# Patient Record
Sex: Male | Born: 1940 | Race: White | Hispanic: No | Smoking: Former smoker
Health system: Southern US, Community
[De-identification: ages and names within clinical notes are randomized; demographics above are authoritative.]

## PROBLEM LIST (undated history)

## (undated) DIAGNOSIS — I420 Dilated cardiomyopathy: Secondary | ICD-10-CM

## (undated) DIAGNOSIS — I5042 Chronic combined systolic (congestive) and diastolic (congestive) heart failure: Secondary | ICD-10-CM

## (undated) DIAGNOSIS — J90 Pleural effusion, not elsewhere classified: Secondary | ICD-10-CM

## (undated) DIAGNOSIS — E43 Unspecified severe protein-calorie malnutrition: Secondary | ICD-10-CM

## (undated) DIAGNOSIS — E7800 Pure hypercholesterolemia, unspecified: Secondary | ICD-10-CM

## (undated) DIAGNOSIS — K869 Disease of pancreas, unspecified: Secondary | ICD-10-CM

## (undated) DIAGNOSIS — E119 Type 2 diabetes mellitus without complications: Secondary | ICD-10-CM

## (undated) DIAGNOSIS — R911 Solitary pulmonary nodule: Secondary | ICD-10-CM

## (undated) DIAGNOSIS — I10 Essential (primary) hypertension: Secondary | ICD-10-CM

## (undated) DIAGNOSIS — S72009A Fracture of unspecified part of neck of unspecified femur, initial encounter for closed fracture: Secondary | ICD-10-CM

## (undated) HISTORY — PX: TONSILLECTOMY: SUR1361

---

## 2013-03-31 ENCOUNTER — Encounter (HOSPITAL_BASED_OUTPATIENT_CLINIC_OR_DEPARTMENT_OTHER): Payer: Self-pay | Admitting: *Deleted

## 2013-03-31 ENCOUNTER — Emergency Department (HOSPITAL_BASED_OUTPATIENT_CLINIC_OR_DEPARTMENT_OTHER)
Admission: EM | Admit: 2013-03-31 | Discharge: 2013-03-31 | Disposition: A | Discharge status: Home / Self Care | Payer: Medicare Other | Attending: Emergency Medicine | Admitting: Emergency Medicine

## 2013-03-31 DIAGNOSIS — Z79899 Other long term (current) drug therapy: Secondary | ICD-10-CM | POA: Insufficient documentation

## 2013-03-31 DIAGNOSIS — Z87891 Personal history of nicotine dependence: Secondary | ICD-10-CM | POA: Insufficient documentation

## 2013-03-31 DIAGNOSIS — E1169 Type 2 diabetes mellitus with other specified complication: Secondary | ICD-10-CM | POA: Insufficient documentation

## 2013-03-31 DIAGNOSIS — R739 Hyperglycemia, unspecified: Secondary | ICD-10-CM

## 2013-03-31 DIAGNOSIS — Z8719 Personal history of other diseases of the digestive system: Secondary | ICD-10-CM | POA: Insufficient documentation

## 2013-03-31 DIAGNOSIS — F0390 Unspecified dementia without behavioral disturbance: Secondary | ICD-10-CM | POA: Insufficient documentation

## 2013-03-31 HISTORY — DX: Disease of pancreas, unspecified: K86.9

## 2013-03-31 LAB — URINALYSIS, ROUTINE W REFLEX MICROSCOPIC
Glucose, UA: >1000 mg/dL — AB
Hgb urine dipstick: NEGATIVE
Protein, ur: NEGATIVE mg/dL
pH: 6 (ref 5.0–8.0)

## 2013-03-31 LAB — CBC WITH DIFFERENTIAL/PLATELET
Eosinophils Absolute: 0.1 10*3/uL (ref 0.0–0.7)
Hemoglobin: 11.5 g/dL — ABNORMAL LOW (ref 13.0–17.0)
Lymphocytes Relative: 26 % (ref 12–46)
Lymphs Abs: 1.3 10*3/uL (ref 0.7–4.0)
MCH: 35 pg — ABNORMAL HIGH (ref 26.0–34.0)
MCV: 99.7 fL (ref 78.0–100.0)
Monocytes Relative: 15 % — ABNORMAL HIGH (ref 3–12)
Neutrophils Relative %: 56 % (ref 43–77)
RBC: 3.29 MIL/uL — ABNORMAL LOW (ref 4.22–5.81)

## 2013-03-31 LAB — URINE MICROSCOPIC-ADD ON

## 2013-03-31 LAB — COMPREHENSIVE METABOLIC PANEL
AST: 40 U/L — ABNORMAL HIGH (ref 0–37)
Albumin: 3.1 g/dL — ABNORMAL LOW (ref 3.5–5.2)
Calcium: 9.4 mg/dL (ref 8.4–10.5)
Creatinine, Ser: 0.5 mg/dL (ref 0.50–1.35)
GFR calc non Af Amer: >90 mL/min (ref >90)

## 2013-03-31 LAB — GLUCOSE, CAPILLARY
Glucose-Capillary: 436 mg/dL — ABNORMAL HIGH (ref 70–99)
Glucose-Capillary: 330 mg/dL — ABNORMAL HIGH (ref 70–99)

## 2013-03-31 MED ORDER — SODIUM CHLORIDE 0.9 % IV BOLUS (SEPSIS)
1000.0000 mL | Freq: Once | INTRAVENOUS | Status: AC
  Administered 2013-03-31: 1000 mL via INTRAVENOUS

## 2013-03-31 NOTE — ED Provider Notes (Signed)
CSN: [id number]     Arrival date & time 03/31/13  1534 History  This chart was scribed for Doug Sou, MD by Ladona Ridgel Day, ED scribe. This patient was seen in room MH10/MH10 and the patient's care was started at 1604. Level V caveat patient chronically confused  First MD Initiated Contact with Patient 03/31/13 1604     Chief Complaint  Patient presents with  . Hyperglycemia   Patient is a 72 y.o. male presenting with hyperglycemia. The history is provided by the patient and a relative. No language interpreter was used.  Hyperglycemia Blood sugar level PTA:  453 Severity:  Moderate Onset quality:  Gradual Duration:  2 hours Timing:  Constant Progression:  Unchanged Chronicity:  New Relieved by:  Nothing Ineffective treatments:  None tried Associated symptoms: no abdominal pain, no chest pain, no fever, no nausea, no shortness of breath and no vomiting    HPI Comments: [name] [name] is a 72 y.o. male who presents to the Emergency Department complaining of high CBG of 453 at home today when measured before dinner this evening, newly diabetic since 1 week ago. 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 Daughter states his BS was fine until later this PM and they came here for elevated BS of 453. He states currently that he feels fine and has been eating well the past week.  He was d/c from [facility] last week and was admitted for duration of 1 week. Daughter states he has been improving and doing well since his d/c. He recently was started on  CREON  1 capsule by mouth 3 times daily before meals and metfomin 500 MG tablet, 2 times daily by mouth. They saw Dr. Leavy Cella after his d/c from [facility] and were previously looking for inpatient rehab for alcoholism but have had difficulty doing so and now are looking for options with assisted living.  Past Medical History  Diagnosis  Date  . Diabetes mellitus without complication   . Pancreatic disorder    History reviewed. No pertinent past surgical history. No family history on file. History  Substance Use Topics  . Smoking status: Former Smoker    Quit date: 03/21/2013  . Smokeless tobacco: Never Used  . Alcohol Use: 12.6 oz/week    21 Cans of beer per week   no cigarettes or alcohol times one week  Review of Systems  Unable to perform ROS: Dementia  Constitutional: Negative for fever and chills.  HENT: Negative for congestion.   Respiratory: Negative for cough and shortness of breath.   Cardiovascular: Negative for chest pain.  Gastrointestinal: Negative for nausea, vomiting and abdominal pain.  Musculoskeletal: Negative for back pain.  Skin: Negative for color change and pallor.  Neurological: Negative for weakness.  All other systems reviewed and are negative.   A complete 10 system review of systems was obtained and all systems are negative except as noted in the HPI and PMH.   Allergies  Review of patient's allergies indicates no known allergies.  Home Medications   Current Outpatient Rx  Name  Route  Sig  Dispense  Refill  . lipase/protease/amylase (CREON-12/PANCREASE) 12000 UNITS CPEP   Oral   Take 1 capsule by mouth 3 (three) times daily before meals.         . metFORMIN (GLUCOPHAGE) 500 MG tablet   Oral   Take 500 mg by mouth 2 (two)  times daily with a meal.         . Multiple Vitamins-Minerals (MULTIVITAMIN WITH MINERALS) tablet   Oral   Take 1 tablet by mouth daily.         . nicotine (NICODERM CQ - DOSED IN MG/24 HOURS) 21 mg/24hr patch   Transdermal   Place 1 patch onto the skin daily.         . Thiamine HCl (VITAMIN B-1) 250 MG tablet   Oral   Take 250 mg by mouth daily.         . Vitamin D, Ergocalciferol, (DRISDOL) 50000 UNITS CAPS   Oral   Take 50,000 Units by mouth every 7 (seven) days.          Triage Vitals: BP 152/68  Pulse 73  Temp(Src) 97.4 F  (36.3 C) (Oral)  Resp 18  Ht 5\' 9"  (1.753 m)  Wt 114 lb (51.71 kg)  BMI 16.83 kg/m2  SpO2 100% Physical Exam  Nursing note and vitals reviewed. Constitutional: He appears well-developed and well-nourished. No distress.  HENT:  Head: Normocephalic and atraumatic.  Eyes: Conjunctivae are normal. Pupils are equal, round, and reactive to light.  Neck: Neck supple. No tracheal deviation present. No thyromegaly present.  Cardiovascular: Normal rate and regular rhythm.   No murmur heard. Pulmonary/Chest: Effort normal and breath sounds normal.  Abdominal: Soft. Bowel sounds are normal. He exhibits no distension. There is no tenderness.  Musculoskeletal: Normal range of motion. He exhibits no edema and no tenderness.  Neurological: He is alert. Coordination normal.  Gait normal  Skin: Skin is warm and dry. No rash noted.  Psychiatric: He has a normal mood and affect.  Pleasant cooperative    ED Course   Procedures (including critical care time) DIAGNOSTIC STUDIES: Oxygen Saturation is 100% on room air, normal by my interpretation.    COORDINATION OF CARE: At 420 PM Discussed treatment plan with patient which includes IV fluids, UA, blood work. Patient agrees.   Labs Reviewed  GLUCOSE, CAPILLARY - Abnormal; Notable for the following:    Glucose-Capillary 436 (*)    All other components within normal limits   No results found. No diagnosis found. Had lengthy discussion with patient and patient's daughter. We will not adjust his medications presently as metformin was felt initially caused diarrhea which has since subsided. Results for orders placed during the hospital encounter of 03/31/13  GLUCOSE, CAPILLARY      Result Value Range   Glucose-Capillary 436 (*) 70 - 99 mg/dL  COMPREHENSIVE METABOLIC PANEL      Result Value Range   Sodium 132 (*) 135 - 145 mEq/L   Potassium 4.7  3.5 - 5.1 mEq/L   Chloride 95 (*) 96 - 112 mEq/L   CO2 28  19 - 32 mEq/L   Glucose, Bld 448 (*) 70  - 99 mg/dL   BUN 10  6 - 23 mg/dL   Creatinine, Ser 1.61  0.50 - 1.35 mg/dL   Calcium 9.4  8.4 - 09.6 mg/dL   Total Protein 6.4  6.0 - 8.3 g/dL   Albumin 3.1 (*) 3.5 - 5.2 g/dL   AST 40 (*) 0 - 37 U/L   ALT 34  0 - 53 U/L   Alkaline Phosphatase 142 (*) 39 - 117 U/L   Total Bilirubin 0.4  0.3 - 1.2 mg/dL   GFR calc non Af Amer >90  >90 mL/min   GFR calc Af Amer >90  >90 mL/min  CBC WITH  DIFFERENTIAL      Result Value Range   WBC 5.0  4.0 - 10.5 K/uL   RBC 3.29 (*) 4.22 - 5.81 MIL/uL   Hemoglobin 11.5 (*) 13.0 - 17.0 g/dL   HCT 16.1 (*) 09.6 - 04.5 %   MCV 99.7  78.0 - 100.0 fL   MCH 35.0 (*) 26.0 - 34.0 pg   MCHC 35.1  30.0 - 36.0 g/dL   RDW 40.9  81.1 - 91.4 %   Platelets 329  150 - 400 K/uL   Neutrophils Relative % 56  43 - 77 %   Neutro Abs 2.8  1.7 - 7.7 K/uL   Lymphocytes Relative 26  12 - 46 %   Lymphs Abs 1.3  0.7 - 4.0 K/uL   Monocytes Relative 15 (*) 3 - 12 %   Monocytes Absolute 0.8  0.1 - 1.0 K/uL   Eosinophils Relative 2  0 - 5 %   Eosinophils Absolute 0.1  0.0 - 0.7 K/uL   Basophils Relative 1  0 - 1 %   Basophils Absolute 0.1  0.0 - 0.1 K/uL  URINALYSIS, ROUTINE W REFLEX MICROSCOPIC      Result Value Range   Color, Urine YELLOW  YELLOW   APPearance CLEAR  CLEAR   Specific Gravity, Urine 1.030  1.005 - 1.030   pH 6.0  5.0 - 8.0   Glucose, UA >1000 (*) NEGATIVE mg/dL   Hgb urine dipstick NEGATIVE  NEGATIVE   Bilirubin Urine NEGATIVE  NEGATIVE   Ketones, ur NEGATIVE  NEGATIVE mg/dL   Protein, ur NEGATIVE  NEGATIVE mg/dL   Urobilinogen, UA 1.0  0.0 - 1.0 mg/dL   Nitrite NEGATIVE  NEGATIVE   Leukocytes, UA NEGATIVE  NEGATIVE  URINE MICROSCOPIC-ADD ON      Result Value Range   Squamous Epithelial / LPF RARE  RARE   WBC, UA 0-2  <3 WBC/hpf   Bacteria, UA RARE  RARE  GLUCOSE, CAPILLARY      Result Value Range   Glucose-Capillary 330 (*) 70 - 99 mg/dL   Comment 1 Notify RN     Comment 2 Documented in Chart     No results found.  MDM   His daughter is  working with Dr.Boyd to find placement for him possibly  assisted-living. He has adequate family support and staying in his daughter's home temporarily, at least for the next few weeks. Patient is to follow up with Dr.Boyd at the office in 2 days. Diagnosis hyperglycemia   Doug Sou, MD 03/31/13 1757

## 2013-03-31 NOTE — ED Notes (Signed)
Newly dx diabetic x 1 week- CBG 453 at home today

## 2013-03-31 NOTE — ED Notes (Signed)
Patient's daughter at bedside reports patient recently admitted to [facility] last wed and was released this past wed 03/28/2013 w/ new diagnosis of diabetes. Pt given RX Metformin Vitamin B, Vitamin D, multi-vitamin,and Creon.  Pt originally from [place] and is living with daughter in her home. Pt began seeing PCP,Dr. Leavy Cella, this week and family is in process of finding patient placement in the area (assisted living services). Daughter reports checked patient's blood sugar tonight before dinner and reported "CBG 453", called MD on-call and was told to come to ED for eval. Pt reports "feel fine", appetite well. Denies symptoms.

## 2013-03-31 NOTE — ED Notes (Signed)
MD at bedside. 

## 2013-03-31 NOTE — ED Notes (Signed)
MD at Bedside.

## 2013-11-28 ENCOUNTER — Emergency Department (HOSPITAL_BASED_OUTPATIENT_CLINIC_OR_DEPARTMENT_OTHER): Payer: Medicare Other

## 2013-11-28 ENCOUNTER — Encounter (HOSPITAL_BASED_OUTPATIENT_CLINIC_OR_DEPARTMENT_OTHER): Payer: Self-pay | Admitting: Emergency Medicine

## 2013-11-28 ENCOUNTER — Inpatient Hospital Stay (HOSPITAL_BASED_OUTPATIENT_CLINIC_OR_DEPARTMENT_OTHER)
Admission: EM | Admit: 2013-11-28 | Discharge: 2013-12-03 | DRG: 291 | Disposition: A | Discharge status: Skilled Nursing Facility | Payer: Medicare Other | Attending: Internal Medicine | Admitting: Internal Medicine

## 2013-11-28 DIAGNOSIS — F1020 Alcohol dependence, uncomplicated: Secondary | ICD-10-CM | POA: Diagnosis present

## 2013-11-28 DIAGNOSIS — Z66 Do not resuscitate: Secondary | ICD-10-CM | POA: Diagnosis present

## 2013-11-28 DIAGNOSIS — E119 Type 2 diabetes mellitus without complications: Secondary | ICD-10-CM

## 2013-11-28 DIAGNOSIS — I5042 Chronic combined systolic (congestive) and diastolic (congestive) heart failure: Secondary | ICD-10-CM

## 2013-11-28 DIAGNOSIS — E1149 Type 2 diabetes mellitus with other diabetic neurological complication: Secondary | ICD-10-CM | POA: Diagnosis present

## 2013-11-28 DIAGNOSIS — F0280 Dementia in other diseases classified elsewhere without behavioral disturbance: Secondary | ICD-10-CM | POA: Diagnosis present

## 2013-11-28 DIAGNOSIS — R531 Weakness: Secondary | ICD-10-CM

## 2013-11-28 DIAGNOSIS — E1142 Type 2 diabetes mellitus with diabetic polyneuropathy: Secondary | ICD-10-CM | POA: Diagnosis present

## 2013-11-28 DIAGNOSIS — F10988 Alcohol use, unspecified with other alcohol-induced disorder: Secondary | ICD-10-CM | POA: Diagnosis present

## 2013-11-28 DIAGNOSIS — E1140 Type 2 diabetes mellitus with diabetic neuropathy, unspecified: Secondary | ICD-10-CM | POA: Diagnosis present

## 2013-11-28 DIAGNOSIS — E1165 Type 2 diabetes mellitus with hyperglycemia: Secondary | ICD-10-CM

## 2013-11-28 DIAGNOSIS — I5021 Acute systolic (congestive) heart failure: Principal | ICD-10-CM | POA: Diagnosis present

## 2013-11-28 DIAGNOSIS — E43 Unspecified severe protein-calorie malnutrition: Secondary | ICD-10-CM | POA: Diagnosis present

## 2013-11-28 DIAGNOSIS — Z79899 Other long term (current) drug therapy: Secondary | ICD-10-CM

## 2013-11-28 DIAGNOSIS — I509 Heart failure, unspecified: Secondary | ICD-10-CM

## 2013-11-28 DIAGNOSIS — E871 Hypo-osmolality and hyponatremia: Secondary | ICD-10-CM | POA: Diagnosis present

## 2013-11-28 DIAGNOSIS — E7800 Pure hypercholesterolemia, unspecified: Secondary | ICD-10-CM

## 2013-11-28 DIAGNOSIS — Z87891 Personal history of nicotine dependence: Secondary | ICD-10-CM

## 2013-11-28 DIAGNOSIS — I059 Rheumatic mitral valve disease, unspecified: Secondary | ICD-10-CM | POA: Diagnosis present

## 2013-11-28 DIAGNOSIS — Z681 Body mass index (BMI) 19 or less, adult: Secondary | ICD-10-CM

## 2013-11-28 DIAGNOSIS — I079 Rheumatic tricuspid valve disease, unspecified: Secondary | ICD-10-CM | POA: Diagnosis present

## 2013-11-28 DIAGNOSIS — R635 Abnormal weight gain: Secondary | ICD-10-CM | POA: Diagnosis present

## 2013-11-28 DIAGNOSIS — Z7982 Long term (current) use of aspirin: Secondary | ICD-10-CM

## 2013-11-28 DIAGNOSIS — I10 Essential (primary) hypertension: Secondary | ICD-10-CM | POA: Diagnosis present

## 2013-11-28 DIAGNOSIS — R0602 Shortness of breath: Secondary | ICD-10-CM

## 2013-11-28 DIAGNOSIS — IMO0002 Reserved for concepts with insufficient information to code with codable children: Secondary | ICD-10-CM | POA: Diagnosis present

## 2013-11-28 DIAGNOSIS — R911 Solitary pulmonary nodule: Secondary | ICD-10-CM | POA: Diagnosis present

## 2013-11-28 DIAGNOSIS — J90 Pleural effusion, not elsewhere classified: Secondary | ICD-10-CM | POA: Diagnosis present

## 2013-11-28 HISTORY — DX: Pure hypercholesterolemia, unspecified: E78.00

## 2013-11-28 HISTORY — DX: Chronic combined systolic (congestive) and diastolic (congestive) heart failure: I50.42

## 2013-11-28 LAB — URINE MICROSCOPIC-ADD ON

## 2013-11-28 LAB — BASIC METABOLIC PANEL
BUN: 12 mg/dL (ref 6–23)
CALCIUM: 9.3 mg/dL (ref 8.4–10.5)
CO2: 27 mEq/L (ref 19–32)
Chloride: 91 mEq/L — ABNORMAL LOW (ref 96–112)
Creatinine, Ser: 0.7 mg/dL (ref 0.50–1.35)
Glucose, Bld: 81 mg/dL (ref 70–99)
Potassium: 4.2 mEq/L (ref 3.7–5.3)
SODIUM: 131 meq/L — AB (ref 137–147)

## 2013-11-28 LAB — TROPONIN I

## 2013-11-28 LAB — URINALYSIS, ROUTINE W REFLEX MICROSCOPIC
Bilirubin Urine: NEGATIVE
Glucose, UA: NEGATIVE mg/dL
Ketones, ur: NEGATIVE mg/dL
LEUKOCYTES UA: NEGATIVE
NITRITE: NEGATIVE
PH: 5.5 (ref 5.0–8.0)
Protein, ur: NEGATIVE mg/dL
SPECIFIC GRAVITY, URINE: 1.009 (ref 1.005–1.030)
Urobilinogen, UA: 0.2 mg/dL (ref 0.0–1.0)

## 2013-11-28 LAB — CBG MONITORING, ED
GLUCOSE-CAPILLARY: 77 mg/dL (ref 70–99)
Glucose-Capillary: 76 mg/dL (ref 70–99)

## 2013-11-28 MED ORDER — FUROSEMIDE 10 MG/ML IJ SOLN
40.0000 mg | Freq: Once | INTRAMUSCULAR | Status: AC
  Administered 2013-11-28: 40 mg via INTRAVENOUS
  Filled 2013-11-28: qty 4

## 2013-11-28 NOTE — ED Notes (Signed)
Pt has attempted to void several times w/o output

## 2013-11-28 NOTE — ED Notes (Signed)
Patient brought to ED by daughter, patient lives in assisted living facility, she sts she has noticed change in activity level and energy, sts patient has been extremely fatigued last 1-2 weeks. Provider at facility ordered blood work and daughter reports the following: BNP 1900 (??) or (19.06). Nursing staff todl daughter to take patient to an ER for CHF Exacerbation. Pt has 20lb weight gain, pitting edema noted to BLE.

## 2013-11-28 NOTE — ED Notes (Signed)
MD at bedside. 

## 2013-11-28 NOTE — ED Notes (Signed)
[facility] at BS-CBG 76-pt's daughter brought in food

## 2013-11-28 NOTE — ED Provider Notes (Addendum)
CSN: [id number]     Arrival date & time 11/28/13  1755 History  This chart was scribed for [name] Sprout, MD by Charline Bills, ED Scribe. The patient was seen in room MH10/MH10. Patient's care was started at 7:17 PM.   Chief Complaint  Patient presents with  . Fatigue    The history is provided by the patient. No language interpreter was used.   HPI Comments: [name] [name] is a 73 y.o. male who presents to the Emergency Department complaining of fatigue onset 1-2 weeks ago. Pt's daughter reports an associated decrease in activity level as well as bilateral leg swelling and bloating. His daughter also reports 15 lb weight gain over the last 1-2 weeks. Pt reports muscle aches, stiffness and intermittent back pain that radiates to his shoulder. Pt's also reports a mild cough and ongoing diarrhea. Pt's daughter is concerned that the medication that the pt was recently placed on has caused the symptoms. Pt was taken off this medication on 11/22/13. Pt denies SOB, chest pain, appetite change and urinary issues.   Past Medical History  Diagnosis Date  . Diabetes mellitus without complication   . Pancreatic disorder    History reviewed. No pertinent past surgical history. No family history on file. History  Substance Use Topics  . Smoking status: Former Smoker    Quit date: 03/21/2013  . Smokeless tobacco: Never Used  . Alcohol Use: 12.6 oz/week    21 Cans of beer per week    Review of Systems  Constitutional: Positive for activity change and fatigue. Negative for appetite change.  Respiratory: Positive for cough. Negative for shortness of breath.   Cardiovascular: Positive for leg swelling. Negative for chest pain.  Gastrointestinal: Positive for diarrhea.  Genitourinary: Negative for dysuria, urgency, frequency and difficulty urinating.  Musculoskeletal: Positive for back pain and myalgias.  All other systems reviewed and are negative.   Allergies  Review of patient's allergies  indicates no known allergies.  Home Medications   Current Outpatient Rx  Name  Route  Sig  Dispense  Refill  . aspirin EC 81 MG tablet   Oral   Take 81 mg by mouth daily.         Marland Kitchen FLUoxetine (PROZAC) 20 MG capsule   Oral   Take 20 mg by mouth daily.         Marland Kitchen glipiZIDE (GLUCOTROL) 10 MG tablet   Oral   Take 10 mg by mouth daily before breakfast.         . lipase/protease/amylase (CREON-12/PANCREASE) 12000 UNITS CPEP   Oral   Take 1 capsule by mouth 3 (three) times daily before meals.         . metFORMIN (GLUCOPHAGE) 500 MG tablet   Oral   Take 500 mg by mouth 2 (two) times daily with a meal.         . Multiple Vitamins-Minerals (MULTIVITAMIN WITH MINERALS) tablet   Oral   Take 1 tablet by mouth daily.         . nicotine (NICODERM CQ - DOSED IN MG/24 HOURS) 21 mg/24hr patch   Transdermal   Place 1 patch onto the skin daily.         . Thiamine HCl (VITAMIN B-1) 250 MG tablet   Oral   Take 250 mg by mouth daily.         . Vitamin D, Ergocalciferol, (DRISDOL) 50000 UNITS CAPS   Oral   Take 50,000 Units by mouth every 7 (seven) days.  Triage Vitals: BP 138/81  Pulse 88  Temp(Src) 97.8 F (36.6 C) (Oral)  Resp 20  Ht 5\' 9"  (1.753 m)  Wt 128 lb (58.06 kg)  BMI 18.89 kg/m2  SpO2 100%  Physical Exam  Nursing note and vitals reviewed. Constitutional: He is oriented to person, place, and time. He appears well-developed and well-nourished. No distress.  HENT:  Head: Normocephalic.  Eyes: Conjunctivae and EOM are normal. Pupils are equal, round, and reactive to light.  Neck: Neck supple.  Cardiovascular: Normal rate, regular rhythm, normal heart sounds and intact distal pulses.   Pulmonary/Chest: Effort normal. No respiratory distress. He has rales (bilateral lower lobes).  Abdominal: Soft. He exhibits no distension. There is no tenderness. There is no rebound and no guarding.  Musculoskeletal: He exhibits edema (2+ pitting edema up to the  thigh bilaterally). He exhibits no tenderness.  Neurological: He is alert and oriented to person, place, and time.  Skin: Skin is warm. He is not diaphoretic.  Psychiatric: He has a normal mood and affect. His behavior is normal.    ED Course  Procedures (including critical care time) DIAGNOSTIC STUDIES: Oxygen Saturation is 100% on RA, normal by my interpretation.    COORDINATION OF CARE: 7:35 PM-Discussed treatment plan which includes medication and return precautions with pt at bedside. Pt agreed to plan.   Labs Review Labs Reviewed  TROPONIN I  URINALYSIS, ROUTINE W REFLEX MICROSCOPIC  BASIC METABOLIC PANEL  CBG MONITORING, ED   Imaging Review Dg Chest 2 View  11/28/2013   CLINICAL DATA:  Fatigue and muscle weakness.  EXAM: CHEST  2 VIEW  COMPARISON:  None.  FINDINGS: Lung volumes are low. There is bibasilar collapse/ consolidation with small bilateral pleural effusions. No evidence for overt pulmonary edema at this time. The cardio pericardial silhouette is enlarged. Bones are diffusely demineralized. Telemetry leads overlie the chest.  IMPRESSION: Bilateral lower lobe collapse/consolidation with small to moderate bilateral pleural effusions.   Electronically Signed   By: Kennith Center M.D.   On: 11/28/2013 20:24     EKG Interpretation   Date/Time:  Wednesday November 28 2013 19:07:49 EDT Ventricular Rate:  88 PR Interval:  160 QRS Duration: 138 QT Interval:  428 QTC Calculation: 517 R Axis:   65 Text Interpretation:  Normal sinus rhythm Possible Left atrial enlargement  Right bundle branch block No previous tracing Confirmed by Anitra Lauth  MD,  Alphonzo Lemmings ([id number]) on 11/28/2013 7:30:36 PM      MDM   Final diagnoses:  CHF (congestive heart failure)   Patient with symptoms concerning for CHF with worsening shortness of breath with ambulation, swelling in his legs and a 20 pound weight gain. He only relating factor is he recently started Actos and was DC'd 5 days ago. Patient  denies chest pain however it's difficult to tell if he had some intermittent chest pain based on his response to questioning. On exam patient has signs of fluid overload. He denies any infectious symptoms and no prior history of CHF. Patient had labs done at the [place] today results are normal CBC and BMP but a BNP of 1900, which is assumed to be new as patient has never had any cardiac issues in the past. EKG with a right bundle branch block and nonspecific ST depression without old to compare. Troponin is negative and chest x-ray with moderate bilateral pleural effusions. Feel patient would benefit from admission and diuresis.  Code status is DNR which was discussed with pt and daughter.  I  personally performed the services described in this documentation, which was scribed in my presence.  The recorded information has been reviewed and considered.    [name] SproutWhitney Alok Minshall, MD 11/28/13 2118  [name] SproutWhitney Zannie Runkle, MD 11/28/13 [phone]

## 2013-11-29 ENCOUNTER — Inpatient Hospital Stay (HOSPITAL_COMMUNITY): Payer: Medicare Other

## 2013-11-29 ENCOUNTER — Encounter (HOSPITAL_COMMUNITY): Payer: Self-pay | Admitting: Internal Medicine

## 2013-11-29 DIAGNOSIS — R5381 Other malaise: Secondary | ICD-10-CM

## 2013-11-29 DIAGNOSIS — I369 Nonrheumatic tricuspid valve disorder, unspecified: Secondary | ICD-10-CM

## 2013-11-29 DIAGNOSIS — I509 Heart failure, unspecified: Secondary | ICD-10-CM

## 2013-11-29 DIAGNOSIS — E1165 Type 2 diabetes mellitus with hyperglycemia: Secondary | ICD-10-CM

## 2013-11-29 DIAGNOSIS — I5021 Acute systolic (congestive) heart failure: Secondary | ICD-10-CM | POA: Diagnosis present

## 2013-11-29 DIAGNOSIS — R5383 Other fatigue: Secondary | ICD-10-CM

## 2013-11-29 DIAGNOSIS — R0602 Shortness of breath: Secondary | ICD-10-CM | POA: Insufficient documentation

## 2013-11-29 DIAGNOSIS — IMO0002 Reserved for concepts with insufficient information to code with codable children: Secondary | ICD-10-CM | POA: Diagnosis present

## 2013-11-29 DIAGNOSIS — I10 Essential (primary) hypertension: Secondary | ICD-10-CM

## 2013-11-29 DIAGNOSIS — F10988 Alcohol use, unspecified with other alcohol-induced disorder: Secondary | ICD-10-CM

## 2013-11-29 DIAGNOSIS — E1140 Type 2 diabetes mellitus with diabetic neuropathy, unspecified: Secondary | ICD-10-CM | POA: Diagnosis present

## 2013-11-29 DIAGNOSIS — E119 Type 2 diabetes mellitus without complications: Secondary | ICD-10-CM

## 2013-11-29 DIAGNOSIS — R531 Weakness: Secondary | ICD-10-CM | POA: Diagnosis present

## 2013-11-29 HISTORY — DX: Essential (primary) hypertension: I10

## 2013-11-29 LAB — HEMOGLOBIN A1C
HEMOGLOBIN A1C: 7.9 % — AB (ref <5.7)
Mean Plasma Glucose: 180 mg/dL — ABNORMAL HIGH (ref <117)

## 2013-11-29 LAB — OSMOLALITY, URINE: OSMOLALITY UR: 280 mosm/kg — AB (ref 390–1090)

## 2013-11-29 LAB — NA AND K (SODIUM & POTASSIUM), RAND UR
Potassium Urine: 12 mEq/L
Sodium, Ur: 116 mEq/L

## 2013-11-29 LAB — GLUCOSE, CAPILLARY
GLUCOSE-CAPILLARY: 108 mg/dL — AB (ref 70–99)
Glucose-Capillary: 100 mg/dL — ABNORMAL HIGH (ref 70–99)
Glucose-Capillary: 138 mg/dL — ABNORMAL HIGH (ref 70–99)
Glucose-Capillary: 159 mg/dL — ABNORMAL HIGH (ref 70–99)
Glucose-Capillary: 64 mg/dL — ABNORMAL LOW (ref 70–99)
Glucose-Capillary: 75 mg/dL (ref 70–99)

## 2013-11-29 MED ORDER — INSULIN ASPART 100 UNIT/ML ~~LOC~~ SOLN: 0.0000 [IU] | Freq: Every day | SUBCUTANEOUS | Status: DC

## 2013-11-29 MED ORDER — LISINOPRIL 2.5 MG PO TABS
2.5000 mg | ORAL_TABLET | Freq: Every day | ORAL | Status: DC
  Administered 2013-11-29: 2.5 mg via ORAL
  Filled 2013-11-29 (×2): qty 1

## 2013-11-29 MED ORDER — FUROSEMIDE 10 MG/ML IJ SOLN
40.0000 mg | Freq: Two times a day (BID) | INTRAMUSCULAR | Status: AC
  Administered 2013-11-29 – 2013-11-30 (×4): 40 mg via INTRAVENOUS
  Filled 2013-11-29 (×4): qty 4

## 2013-11-29 MED ORDER — CARVEDILOL 3.125 MG PO TABS
3.1250 mg | ORAL_TABLET | Freq: Two times a day (BID) | ORAL | Status: DC
  Administered 2013-11-29 – 2013-12-03 (×9): 3.125 mg via ORAL
  Filled 2013-11-29 (×11): qty 1

## 2013-11-29 MED ORDER — INSULIN ASPART 100 UNIT/ML ~~LOC~~ SOLN
0.0000 [IU] | Freq: Three times a day (TID) | SUBCUTANEOUS | Status: DC
  Administered 2013-11-29: 2 [IU] via SUBCUTANEOUS
  Administered 2013-11-30: 1 [IU] via SUBCUTANEOUS
  Administered 2013-11-30 – 2013-12-01 (×2): 3 [IU] via SUBCUTANEOUS
  Administered 2013-12-01 – 2013-12-03 (×4): 2 [IU] via SUBCUTANEOUS

## 2013-11-29 MED ORDER — SODIUM CHLORIDE 0.9 % IV SOLN: 250.0000 mL | INTRAVENOUS | Status: DC | PRN

## 2013-11-29 MED ORDER — ENOXAPARIN SODIUM 30 MG/0.3ML ~~LOC~~ SOLN
30.0000 mg | SUBCUTANEOUS | Status: DC
  Administered 2013-11-29 – 2013-12-02 (×4): 30 mg via SUBCUTANEOUS
  Filled 2013-11-29 (×5): qty 0.3

## 2013-11-29 MED ORDER — ASPIRIN EC 81 MG PO TBEC
81.0000 mg | DELAYED_RELEASE_TABLET | Freq: Every day | ORAL | Status: DC
  Administered 2013-11-29 – 2013-12-03 (×5): 81 mg via ORAL
  Filled 2013-11-29 (×5): qty 1

## 2013-11-29 MED ORDER — GLIPIZIDE 10 MG PO TABS
10.0000 mg | ORAL_TABLET | Freq: Every day | ORAL | Status: DC
  Administered 2013-11-29 – 2013-12-01 (×3): 10 mg via ORAL
  Filled 2013-11-29 (×4): qty 1

## 2013-11-29 MED ORDER — ADULT MULTIVITAMIN W/MINERALS CH
1.0000 | ORAL_TABLET | Freq: Every day | ORAL | Status: DC
  Administered 2013-11-29 – 2013-12-03 (×5): 1 via ORAL
  Filled 2013-11-29 (×5): qty 1

## 2013-11-29 MED ORDER — LEVOFLOXACIN IN D5W 750 MG/150ML IV SOLN
750.0000 mg | INTRAVENOUS | Status: DC
  Administered 2013-11-29: 750 mg via INTRAVENOUS
  Filled 2013-11-29 (×3): qty 150

## 2013-11-29 MED ORDER — VITAMIN B-1 50 MG PO TABS
250.0000 mg | ORAL_TABLET | Freq: Every day | ORAL | Status: DC
  Administered 2013-11-29 – 2013-12-03 (×5): 250 mg via ORAL
  Filled 2013-11-29 (×5): qty 1

## 2013-11-29 MED ORDER — IOHEXOL 300 MG/ML  SOLN
75.0000 mL | Freq: Once | INTRAMUSCULAR | Status: AC | PRN
  Administered 2013-11-29: 75 mL via INTRAVENOUS

## 2013-11-29 MED ORDER — SODIUM CHLORIDE 0.9 % IJ SOLN
3.0000 mL | Freq: Two times a day (BID) | INTRAMUSCULAR | Status: DC
  Administered 2013-11-29 – 2013-12-01 (×6): 3 mL via INTRAVENOUS

## 2013-11-29 MED ORDER — FLUOXETINE HCL 20 MG PO CAPS
20.0000 mg | ORAL_CAPSULE | Freq: Every day | ORAL | Status: DC
  Administered 2013-11-29 – 2013-12-03 (×5): 20 mg via ORAL
  Filled 2013-11-29 (×5): qty 1

## 2013-11-29 MED ORDER — POTASSIUM CHLORIDE CRYS ER 10 MEQ PO TBCR
10.0000 meq | EXTENDED_RELEASE_TABLET | Freq: Every day | ORAL | Status: AC
  Administered 2013-11-29 – 2013-11-30 (×2): 10 meq via ORAL
  Filled 2013-11-29 (×2): qty 1

## 2013-11-29 MED ORDER — NICOTINE 21 MG/24HR TD PT24
21.0000 mg | MEDICATED_PATCH | TRANSDERMAL | Status: DC
  Administered 2013-11-29 – 2013-12-03 (×5): 21 mg via TRANSDERMAL
  Filled 2013-11-29 (×5): qty 1

## 2013-11-29 MED ORDER — VITAMIN D (ERGOCALCIFEROL) 1.25 MG (50000 UNIT) PO CAPS
50000.0000 [IU] | ORAL_CAPSULE | ORAL | Status: DC
  Administered 2013-11-30: 50000 [IU] via ORAL
  Filled 2013-11-29: qty 1

## 2013-11-29 MED ORDER — PANCRELIPASE (LIP-PROT-AMYL) 12000-38000 UNITS PO CPEP
1.0000 | ORAL_CAPSULE | Freq: Three times a day (TID) | ORAL | Status: DC
  Administered 2013-11-29 – 2013-12-03 (×13): 1 via ORAL
  Filled 2013-11-29 (×16): qty 1

## 2013-11-29 MED ORDER — SODIUM CHLORIDE 0.9 % IJ SOLN: 3.0000 mL | INTRAMUSCULAR | Status: DC | PRN

## 2013-11-29 NOTE — H&P (Signed)
[facility] History and Physical  [name] [name] VHQ:[id number] DOB: [birth year] DOA: 11/28/2013  Referring physician: EDP PCP: [name] Au, MD  Specialists:   Chief Complaint:  SOB and Increased Swelling of Legs and Weakness  HPI: [name] [name] is a 73 y.o. male with Dementia, and DM2 who resides in an Area ALF who was sent to the [facility] ED for evaluation due to lethargy and "bloating" which has worsened over the past 3 days.  He has had SOB and Cough as well.   His daughter gives the histtry and reports that over the past 30 days he has rapidly gained weight approximatedly  20 pounds and had noticeable swelling of his legs that was progressing up his body.   She has been concerned that the Actos, a recently added medication may be causing the Swelling.    In the ED, he was evaluated and found to have a BNP in the 1900s, and a chest X-ray which revealed Bilateral Effusions.  He was administered IV Lasix and referred for medical admission.      Review of Systems: Unable to Obtain from the Patient  Past Medical History  Diagnosis Date  . Diabetes mellitus without complication   . Pancreatic disorder     History reviewed. No pertinent past surgical history.     Prior to Admission medications   Medication Sig Start Date End Date Taking? Authorizing Provider  aspirin EC 81 MG tablet Take 81 mg by mouth daily.   Yes Historical Provider, MD  FLUoxetine (PROZAC) 20 MG capsule Take 20 mg by mouth daily.   Yes Historical Provider, MD  glipiZIDE (GLUCOTROL) 10 MG tablet Take 10 mg by mouth daily before breakfast.   Yes Historical Provider, MD  lipase/protease/amylase (CREON-12/PANCREASE) 12000 UNITS CPEP Take 1 capsule by mouth 3 (three) times daily before meals.    Historical Provider, MD  metFORMIN (GLUCOPHAGE) 500 MG tablet Take 500 mg by mouth 2 (two) times daily with a meal.    Historical Provider, MD  Multiple Vitamins-Minerals (MULTIVITAMIN WITH MINERALS) tablet Take 1 tablet  by mouth daily.    Historical Provider, MD  nicotine (NICODERM CQ - DOSED IN MG/24 HOURS) 21 mg/24hr patch Place 1 patch onto the skin daily.    Historical Provider, MD  Thiamine HCl (VITAMIN B-1) 250 MG tablet Take 250 mg by mouth daily.    Historical Provider, MD  Vitamin D, Ergocalciferol, (DRISDOL) 50000 UNITS CAPS Take 50,000 Units by mouth every 7 (seven) days.    Historical Provider, MD      No Known Allergies   Social History:  reports that he quit smoking about 8 months ago. He has never used smokeless tobacco. He reports that he drinks about 12.6 ounces of alcohol per week. He reports that he does not use illicit drugs.     Family History  Problem Relation Age of Onset  . Breast cancer Mother   . CAD Father   . Cancer - Other Sister     Laryngeal       Physical Exam:  GEN:  Pleasant Thin Elderly  73 y.o. Caucasian male  examined  and in no acute distress; cooperative with exam Filed Vitals:   11/28/13 1818 11/28/13 2137 11/28/13 2331 11/28/13 2353  BP: 138/81 135/86 145/82   Pulse: 88 92 91 90  Temp: 97.8 F (36.6 C) 98.2 F (36.8 C) 96.4 F (35.8 C)   TempSrc: Oral Oral Oral   Resp: 20 20 20    Height: 5\' 9"  (  1.753 m)     Weight: 58.06 kg (128 lb)  62.7 kg (138 lb 3.7 oz)   SpO2: 100% 94% 100%    Blood pressure 145/82, pulse 90, temperature 96.4 F (35.8 C), temperature source Oral, resp. rate 20, height 5\' 9"  (1.753 m), weight 62.7 kg (138 lb 3.7 oz), SpO2 100.00%. PSYCH: He is alert and oriented x2; does not appear anxious does not appear depressed; affect is normal HEENT: Normocephalic and Atraumatic, Mucous membranes pink; PERRLA; EOM intact; Fundi:  Benign;  No scleral icterus, Nares: Patent, Oropharynx: Clear,  Fair Dentition, Neck:  FROM, no cervical lymphadenopathy nor thyromegaly or carotid bruit; no JVD; Breasts:: Not examined CHEST WALL: No tenderness CHEST: Normal respiration, clear to auscultation bilaterally HEART: Regular rate and rhythm; no  murmurs rubs or gallops BACK: No kyphosis or scoliosis; no CVA tenderness ABDOMEN: Positive Bowel Sounds, Scaphoid, soft non-tender; no masses, no organomegaly Rectal Exam: Not done EXTREMITIES: No  cyanosis, clubbing or 2+ EDEMA of BLEs; no ulcerations. Genitalia: not examined PULSES: 2+ and symmetric SKIN: Normal hydration no rash or ulceration CNS: Alert and Oriented x 2,  No Focal Deficits Vascular: pulses palpable throughout    Labs on Admission:  Basic Metabolic Panel:  Recent Labs Lab 11/28/13 2235  NA 131*  K 4.2  CL 91*  CO2 27  GLUCOSE 81  BUN 12  CREATININE 0.70  CALCIUM 9.3   Liver Function Tests: No results found for this basename: AST, ALT, ALKPHOS, BILITOT, PROT, ALBUMIN,  in the last 168 hours No results found for this basename: LIPASE, AMYLASE,  in the last 168 hours No results found for this basename: AMMONIA,  in the last 168 hours CBC: No results found for this basename: WBC, NEUTROABS, HGB, HCT, MCV, PLT,  in the last 168 hours Cardiac Enzymes:  Recent Labs Lab 11/28/13 1920  TROPONINI <0.30    BNP (last 3 results) No results found for this basename: PROBNP,  in the last 8760 hours CBG:  Recent Labs Lab 11/28/13 2135 11/28/13 2237 11/28/13 2355  GLUCAP 77 76 75    Radiological Exams on Admission: Dg Chest 2 View  11/28/2013   CLINICAL DATA:  Fatigue and muscle weakness.  EXAM: CHEST  2 VIEW  COMPARISON:  None.  FINDINGS: Lung volumes are low. There is bibasilar collapse/ consolidation with small bilateral pleural effusions. No evidence for overt pulmonary edema at this time. The cardio pericardial silhouette is enlarged. Bones are diffusely demineralized. Telemetry leads overlie the chest.  IMPRESSION: Bilateral lower lobe collapse/consolidation with small to moderate bilateral pleural effusions.   Electronically Signed   By: Kennith CenterEric  Mansell M.D.   On: 11/28/2013 20:24      Assessment/Plan:   73 y.o. 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   Acute CHF- CHF protocol ordered and Diurese with IV Lasix, monitor electrolytes and BUN/Cr.   2D ECHO ordered in AM.     2.   SOB- due to  #1,  O2 PRN. Monitor O2 Sats.    3.   DM2-  Continue  Glipizide RX, Discontinue Glucophage, and Actos Rx due to EDEMA.  SSI coverage Added and check HbA1C.    4.   HTN-  Monitor  BPs.    5.   Weakness due to #1.  And Hyponatremia.    6.  Hyponatremia-  Due to Volume Overload,  Send Urine Electrolytes and urin OSms.    7.    Dementia-  Due to past hx of Alcohol abuse.   ? Wernickes Dementia  8.    DVT Prophylaxis with Lovenox.       Code Status:      DO NOT RESUSCITATE Family Communication:    Daughter at Bedside Disposition Plan:       Inpatient  Time spent:  31 Minutes   Ishita Mcnerney C [facility] Pager [phone]  If 7PM-7AM, please contact night-coverage [email or web address] Password [email or web address] 11/29/2013, 1:01 AM

## 2013-11-29 NOTE — Progress Notes (Signed)
Echocardiogram 2D Echocardiogram has been performed.  [name] [name] 11/29/2013, 11:00 AM

## 2013-11-29 NOTE — Clinical Social Work Psychosocial (Signed)
Clinical Social Work Department BRIEF PSYCHOSOCIAL ASSESSMENT 11/29/2013  Patient:  [name] [name], [name] [name]     Account Number:  [id number]     Admit date:  11/28/2013  Clinical Social Worker:  Baruch Goldmann, CLINICAL SOCIAL WORKER  Date/Time:  11/29/2013 02:15 PM  Referred by:  Physician  Date Referred:  11/29/2013  Other Referral:   From [facility]   Interview type:  Family Other interview type:    PSYCHOSOCIAL DATA Living Status:  FACILITY Admitted from facility:  [facility] Level of care:  Assisted Living Primary support name:  [name] [name] ([phone]) Primary support relationship to patient:  CHILD, ADULT Degree of support available:   Strong    CURRENT CONCERNS Current Concerns  Post-Acute Placement   Other Concerns:    SOCIAL WORK ASSESSMENT / PLAN SW student called pts daughter [name] [name]) via the phone. SW student asked pts daughter if pt planned on returning to [facility] after d/c. Daughter stated "I imagine he would want to return." Daughter also stated that he is living in the assisted living unit and has a private room. Pt has been living in this facility since March 1st per the daughter. Pt doesn't have any HH services, but before moving to [facility], had [place] for about a month, per the daughter. SW student contacted [facility] and spoke with [name] [name] in Intake and [name] [name] stated that it is okay for pt to return at d/c. FL2 is on chart for MD's signature.   Assessment/plan status:  Psychosocial Support/Ongoing Assessment of Needs Other assessment/ plan:   Information/referral to community resources:    PATIENT'S/FAMILY'S RESPONSE TO PLAN OF CARE: SW Student spoke with pts daughter via the phone. Daughter was very supportive of pts return to [facility]. Daughter was thankful for SW Student call and was very pleasant. Sw student also contacted [facility] to ensure that return was okay. Student SW spoke to [place] in Intake. [name] [name]  stated that it was okay for pt to return to facility at d/c.    Baruch Goldmann, [place] Intern  [phone]

## 2013-11-29 NOTE — Progress Notes (Addendum)
CRITICAL VALUE ALERT  Critical value received: CBG=64  Date of notification: 11-29-13  Time of notification:  1618  Critical value read back:yes  Nurse who received alert:  Junie Panning  MD notified (1st page):  Ranga  Time of first page:  1702  MD notified (2nd page):  Time of second page:  Responding MD:  Venetia Constable  Time MD responded:  1703  CBG is now 108. No new orders given will continue to monitor patient for further changes in condition.

## 2013-11-29 NOTE — Progress Notes (Signed)
UR completed Madolin Twaddle K. Zyona Pettaway, RN, BSN, MSHL, CCM  11/29/2013 10:36 AM

## 2013-11-29 NOTE — H&P (Signed)
I have seen and examined Mr Kregel at bedside and reviewed his chart. He came in with SOB/cough, bloating. He is being investigated for CHF. I have added CT chest with contrast to evaluate for possible lung mass, and started Levaquin in addition to other interventions as laid out by Dr Lovell Sheehan. Please refer to her comprehensive assessment and care plan.

## 2013-11-29 NOTE — Progress Notes (Signed)
Report given to receiving RN. Family at bedside and patient sitting on side of the bed. No signs or symptoms of distress or discomfort. No verbal complaints.

## 2013-11-29 NOTE — Progress Notes (Signed)
Pt c/o nagging pain left back shoulder blade. 5/10. Applied hot packs. Pt felt relief.

## 2013-11-29 NOTE — Care Management Note (Addendum)
  Page 1 of 1   11/30/2013     4:30:06 PM   CARE MANAGEMENT NOTE 11/30/2013  Patient:  [name],[name]   Account Number:  [id number]  Date Initiated:  11/29/2013  Documentation initiated by:  Guenther Dunshee  Subjective/Objective Assessment:   Admitted with edema and weight gain, CHF.     Action/Plan:   CM to follow for dispositon needs   Anticipated DC Date:  12/02/2013   Anticipated DC Plan:  ASSISTED LIVING / REST HOME         Choice offered to / List presented to:             Status of service:  In process, will continue to follow Medicare Important Message given?   (If response is "NO", the following Medicare IM given date fields will be blank) Date Medicare IM given:   Date Additional Medicare IM given:    Discharge Disposition:    Per UR Regulation:  Reviewed for med. necessity/level of care/duration of stay  If discussed at Long Length of Stay Meetings, dates discussed:    Comments:  Admitted with leg edema and CHF Social:  From [facility] IV Lasix PT RECS:  pending Disposition:  pending Kesean Serviss RN, BSN, MSHL, CCM 11/30/2013

## 2013-11-30 ENCOUNTER — Encounter (HOSPITAL_COMMUNITY): Payer: Self-pay | Admitting: Cardiology

## 2013-11-30 DIAGNOSIS — R911 Solitary pulmonary nodule: Secondary | ICD-10-CM | POA: Diagnosis present

## 2013-11-30 DIAGNOSIS — J90 Pleural effusion, not elsewhere classified: Secondary | ICD-10-CM | POA: Diagnosis present

## 2013-11-30 LAB — GLUCOSE, CAPILLARY
GLUCOSE-CAPILLARY: 77 mg/dL (ref 70–99)
GLUCOSE-CAPILLARY: 43 mg/dL — AB (ref 70–99)
GLUCOSE-CAPILLARY: 48 mg/dL — AB (ref 70–99)
Glucose-Capillary: 139 mg/dL — ABNORMAL HIGH (ref 70–99)
Glucose-Capillary: 234 mg/dL — ABNORMAL HIGH (ref 70–99)
Glucose-Capillary: 120 mg/dL — ABNORMAL HIGH (ref 70–99)
Glucose-Capillary: 17 mg/dL — CL (ref 70–99)

## 2013-11-30 LAB — MAGNESIUM: Magnesium: 1.7 mg/dL (ref 1.5–2.5)

## 2013-11-30 LAB — CBC
HCT: 32.5 % — ABNORMAL LOW (ref 39.0–52.0)
Hemoglobin: 11.6 g/dL — ABNORMAL LOW (ref 13.0–17.0)
MCH: 31.1 pg (ref 26.0–34.0)
MCHC: 35.7 g/dL (ref 30.0–36.0)
MCV: 87.1 fL (ref 78.0–100.0)
Platelets: 258 10*3/uL (ref 150–400)
RBC: 3.73 MIL/uL — ABNORMAL LOW (ref 4.22–5.81)
RDW: 12.7 % (ref 11.5–15.5)
WBC: 5.5 10*3/uL (ref 4.0–10.5)

## 2013-11-30 LAB — BASIC METABOLIC PANEL
BUN: 13 mg/dL (ref 6–23)
CO2: 27 mEq/L (ref 19–32)
Calcium: 9.2 mg/dL (ref 8.4–10.5)
Chloride: 90 mEq/L — ABNORMAL LOW (ref 96–112)
Creatinine, Ser: 0.68 mg/dL (ref 0.50–1.35)
GFR calc Af Amer: >90 mL/min (ref >90)
GFR calc non Af Amer: >90 mL/min (ref >90)
Glucose, Bld: 72 mg/dL (ref 70–99)
Potassium: 4.8 mEq/L (ref 3.7–5.3)
Sodium: 130 mEq/L — ABNORMAL LOW (ref 137–147)

## 2013-11-30 LAB — CLOSTRIDIUM DIFFICILE BY PCR: Toxigenic C. Difficile by PCR: NEGATIVE

## 2013-11-30 LAB — TSH: TSH: 3.43 u[IU]/mL (ref 0.350–4.500)

## 2013-11-30 LAB — PHOSPHORUS: Phosphorus: 3.5 mg/dL (ref 2.3–4.6)

## 2013-11-30 MED ORDER — DEXTROSE 50 % IV SOLN
INTRAVENOUS | Status: AC
Start: 2013-11-30 — End: 2013-11-30
  Administered 2013-11-30: 50 mL
  Filled 2013-11-30: qty 50

## 2013-11-30 MED ORDER — SIMVASTATIN 20 MG PO TABS
20.0000 mg | ORAL_TABLET | Freq: Every day | ORAL | Status: DC
  Administered 2013-11-30 – 2013-12-02 (×3): 20 mg via ORAL
  Filled 2013-11-30 (×5): qty 1

## 2013-11-30 MED ORDER — LISINOPRIL 5 MG PO TABS
5.0000 mg | ORAL_TABLET | Freq: Every day | ORAL | Status: DC
  Administered 2013-11-30 – 2013-12-03 (×3): 5 mg via ORAL
  Filled 2013-11-30 (×4): qty 1

## 2013-11-30 MED ORDER — DEXTROSE 50 % IV SOLN
INTRAVENOUS | Status: AC
  Administered 2013-11-30: 50 mL
  Filled 2013-11-30: qty 50

## 2013-11-30 NOTE — Progress Notes (Signed)
Report given to receiving RN. Patient in bed resting watching TV. Patient has no verbal complaints and no signs or symptoms of distress or discomfort.  

## 2013-11-30 NOTE — Clinical Social Work Psychosocial (Signed)
The  SW Intern's Psychosocial Assessment and been read and reviewed and agree to it's content.  CSW will continue to monitor for possible date of stability and return to [facility] when medically stable per MD. Clovis Cao on chart for MD's signature.  Lorri Frederick. [place]  [phone]

## 2013-11-30 NOTE — Significant Event (Signed)
Rapid Response Event Note  Overview: Time Called: 2150 Arrival Time: 2153 Event Type: Other (Comment) (CBG 17)  Initial Focused Assessment: Called to bedside for CBG 17, patient had been found by nurse tech confused in bed, patient lethargic, D50 IV given per protocol. Patient's skin cool and dry, initially placed on NRB 100%, switched to [place] 2L sats 100%, RR 18, HR 60, BP 122/64. Recheck BG approximately 10 minutes after first D50 given, CBG 48. Additional D50 IV given by bedside RN. Patient lethargic able to state name and that he is in the hospital (baseline orientation for patient).  Interventions:  Advised bedside RN to recheck BG in 15 minutes, and to call with further needs. Will continue to monitor Event Summary:   at      at    Outcome: Stayed in room and stabalized  Event End Time: 2216  [place] Isidoro Donning

## 2013-11-30 NOTE — Progress Notes (Signed)
CSW spoke with Physical Therapist who stated that she is recommending SNF placement for patient.  CSW met with patient's daughter [name] [name] and ex-wife [name] [name] earlier today and then again this afternoon to discuss d/c options/needs.  Discussed bed search process and they are requesting [name] [name] if possible as this is very close to where daughter lives.  Bed search initiated in [facility] for SNF option.  Patient is aware but due to his level of confusion, is having a difficult time adjusting to this idea. He has only been at [facility] for about a month so was still attempting to transition and adjust there.  Awaiting possible bed offers and will ask weekend CSW to follow up with family re: offers.  Plan d/c first of the week to SNF if medically stable per MD.  [name] [name] updated and placed on chart for MD's signature.  [name] [name]. [place], [place]

## 2013-11-30 NOTE — Progress Notes (Signed)
[facility] PROGRESS NOTE  Sunil Hue ZOX:[id number] DOB: August 26, 1941 DOA: 11/28/2013 PCP: Verlon Au, MD  Brief Narrative [name] [name] is a pleasant 73 y.o. male with Dementia, and DM2 who resides in an Area ALF who was sent to the [facility] ED for evaluation due to lethargy and "bloating" which had worsened over the past 3 days prior to the admission. He also had SOB and cough. Apparently, that over the the patient rapidly gained weight approximatedly 20 pounds in a 30 day period and had noticeable swelling of his legs that was progressing up his body. In the ED, he was evaluated and found to have a BNP in the 1900s, and a chest X-ray which revealed Bilateral Effusions. CT of the chest eventually showed "1. Moderate to large right greater than left layering pleural effusions with compressive atelectasis. 2. Seven mm right middle lobe lung nodule (series 3, image 38). If the patient is at high risk for bronchogenic carcinoma, follow-up chest CT at 3-6 months is recommended". He also had 2-D echocardiogram which showed " Left ventricle: The cavity size was mildly dilated. Wall thickness was normal. The estimated ejection fraction was 15%. Diffuse hypokinesis. Features are consistent with a pseudonormal left ventricular filling pattern, with concomitant abnormal relaxation and increased filling  pressure (grade 2 diastolic dysfunction). Doppler parameters are consistent with high ventricular filling pressure. - Mitral valve: Mild regurgitation. - Left atrium: The atrium was mildly dilated. - Right ventricle: The cavity size was mildly dilated. Systolic function was severely reduced. - Right atrium: The atrium was mildly dilated. - Tricuspid valve: Moderate regurgitation. - Pulmonary arteries: Systolic pressure was mildly to moderately increased. PA peak pressure: 49mm Hg (S). - Pericardium, extracardiac: A trivial pericardial effusion was identified. There was a left pleural effusion". Patient  was seen by cardiology later today. Appreciate help. In the meanwhile, will discontinue Levaquin as there is no evidence of infection therefore his symptoms may be related to the CHF alone. He will need pulmonary evaluation once cardiac issues have been addressed. Today he is not giving a meaningful history.  Plan: Acute CHF/SOB (shortness of breath)/Pleural effusion/Hypertension  Continue Lasix/Coreg/ASA. Add statin  Follow cardiology recommendations Type II or unspecified type diabetes mellitus without mention of complication, not stated as uncontrolled  Hemoglobin A1c 7.9  Blood sugars fluctuating  Continue on current medications. 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 Consult pulmonary once cardiac issues addressed as history of tobacco habituation. DVT/GI prophylaxis  Lovenox  PPI Code Status: Full Code. Family Communication: Spoke with patient's daughter over the phone yesterday(11/29/13) evening. Disposition Plan: May need short-term rehabilitation placement   Consultants:  Cardiology  Procedures:  2-D echocardiogram  Antibiotics:  Levaquin 11/29/13>11/30/13.  HPI/Subjective: No specific complaint.  Objective: Filed Vitals:   11/30/13 0406  BP: 132/88  Pulse: 88  Temp: 97.2 F (36.2 C)  Resp: 18    Intake/Output Summary (Last 24 hours) at 11/30/13 0914 Last data filed at 11/30/13 0904  Gross per 24 hour  Intake    848 ml  Output   1726 ml  Net   -878 ml   Filed Weights   11/28/13 2331 11/29/13 0259 11/30/13 0406  Weight: 62.7 kg (138 lb 3.7 oz) 61.372 kg (135 lb 4.8 oz) 58.786 kg (129 lb 9.6 oz)    Exam:   General:  Appears disoriented  Cardiovascular: S1S2 heard. No murmurs. RRR.  Respiratory: Good air entry bilaterally. No wheezing.  Abdomen: Soft and nontender. Positive bowel sounds.  Musculoskeletal: No pedal edema.  Data  Reviewed: Basic Metabolic Panel:  Recent Labs Lab 11/28/13 2235 11/30/13 0411  NA 131* 130*  K 4.2 4.8  CL 91* 90*  CO2 27 27  GLUCOSE 81 72  BUN 12 13  CREATININE 0.70 0.68  CALCIUM 9.3 9.2  MG  --  1.7  PHOS  --  3.5   Liver Function Tests: No results found for this basename: AST, ALT, ALKPHOS, BILITOT, PROT, ALBUMIN,  in the last 168 hours No results found for this basename: LIPASE, AMYLASE,  in the last 168 hours No results found for this basename: AMMONIA,  in the last 168 hours CBC:  Recent Labs Lab 11/30/13 0411  WBC 5.5  HGB 11.6*  HCT 32.5*  MCV 87.1  PLT 258   Cardiac Enzymes:  Recent Labs Lab 11/28/13 1920  TROPONINI <0.30   BNP (last 3 results) No results found for this basename: PROBNP,  in the last 8760 hours CBG:  Recent Labs Lab 11/29/13 1147 11/29/13 1618 11/29/13 1651 11/29/13 2115 11/30/13 0600  GLUCAP 159* 64* 108* 138* 77    Recent Results (from the past 240 hour(s))  CLOSTRIDIUM DIFFICILE BY PCR     Status: None   Collection Time    11/29/13  4:21 PM      Result Value Ref Range Status   C difficile by pcr NEGATIVE  NEGATIVE Final     Studies: Dg Chest 2 View  11/28/2013   CLINICAL DATA:  Fatigue and muscle weakness.  EXAM: CHEST  2 VIEW  COMPARISON:  None.  FINDINGS: Lung volumes are low. There is bibasilar collapse/ consolidation with small bilateral pleural effusions. No evidence for overt pulmonary edema at this time. The cardio pericardial silhouette is enlarged. Bones are diffusely demineralized. Telemetry leads overlie the chest.  IMPRESSION: Bilateral lower lobe collapse/consolidation with small to moderate bilateral pleural effusions.   Electronically Signed   By: Kennith CenterEric  Mansell M.D.   On: 11/28/2013 20:24   Ct Chest W Contrast  11/29/2013   CLINICAL DATA:  73 year old male with shortness of breath and cough. Recent weight gain and lower extremity swelling. Pleural effusions. Initial encounter.  EXAM: CT CHEST WITH CONTRAST   TECHNIQUE: Multidetector CT imaging of the chest was performed during intravenous contrast administration.  CONTRAST:  75mL OMNIPAQUE IOHEXOL 300 MG/ML  SOLN  COMPARISON:  Chest radiographs 11/28/2013.  FINDINGS: Moderate to large bilateral layering pleural effusions, the right is larger. Simple fluid densitometry suggesting transudate fluid. No pericardial effusion. Cardiomegaly. Widespread atherosclerotic plaque in the aorta. Coronary artery involvement.  Compressive atelectasis in both lungs. There is also a 7 mm right middle lobe lung nodule on series 3, image 38. Major airways are patent.  No acute or suspicious osseous lesion. Mild chronic left lateral tenth rib fracture. Mild scoliosis.  No axillary or mediastinal lymphadenopathy.  Visualized upper abdomen remarkable for confluent dystrophic calcifications throughout the visible pancreas.  IMPRESSION: 1. Moderate to large right greater than left layering pleural effusions with compressive atelectasis. 2. Seven mm right middle lobe lung nodule (series 3, image 38). If the patient is at high risk for bronchogenic carcinoma, follow-up chest CT at 3-6 months is recommended. If the patient is at low risk for bronchogenic carcinoma, follow-up chest CT at 6-12 months is recommended. This recommendation follows the consensus statement: Guidelines for Management of Small Pulmonary Nodules Detected on CT Scans: A Statement from the Fleischner Society as published in  Radiology 2005; 182:993-716. 3. Chronic calcific pancreatitis.   Electronically Signed   By: Augusto Gamble M.D.   On: 11/29/2013 13:54    Scheduled Meds: . aspirin EC  81 mg Oral Daily  . carvedilol  3.125 mg Oral BID WC  . enoxaparin (LOVENOX) injection  30 mg Subcutaneous Q24H  . FLUoxetine  20 mg Oral Daily  . furosemide  40 mg Intravenous Q12H  . glipiZIDE  10 mg Oral QAC breakfast  . insulin aspart  0-5 Units Subcutaneous QHS  . insulin aspart  0-9 Units Subcutaneous TID WC  .  lipase/protease/amylase  1 capsule Oral TID AC  . lisinopril  2.5 mg Oral Daily  . multivitamin with minerals  1 tablet Oral Daily  . nicotine  21 mg Transdermal Q24H  . potassium chloride  10 mEq Oral Daily  . sodium chloride  3 mL Intravenous Q12H  . vitamin B-1  250 mg Oral Daily  . Vitamin D (Ergocalciferol)  50,000 Units Oral Q Fri   Continuous Infusions:      Blythe Veach  [facility] Pager [phone]. If 7PM-7AM, please contact night-coverage at [email or web address], password [facility] 11/30/2013, 9:14 AM  LOS: 2 days

## 2013-11-30 NOTE — Plan of Care (Signed)
Problem: Phase I Progression Outcomes Goal: EF % per last Echo/documented,Core Reminder form on chart Outcome: Completed/Met Date Met:  11/30/13 EF=15%

## 2013-11-30 NOTE — Consult Note (Signed)
HPI: 73 year old male with no prior cardiac history for evaluation of acute systolic congestive heart failure. Patient has a long history of alcohol abuse as well as diabetes mellitus. He resides in an assisted living facility. His daughter recently noticed that he had increased weight gain, bilateral lower extremity edema and dyspnea on exertion. There is orthopnea. He has had some back pain but no chest pain. He was sent to the emergency room and has improved with diuresis. Echocardiogram showed severe LV dysfunction and cardiology is asked to evaluate. Patient has significant dementia.  Medications Prior to Admission  Medication Sig Dispense Refill  . aspirin EC 81 MG tablet Take 81 mg by mouth daily.      . Cholecalciferol (VITAMIN D-3) 1000 UNITS CAPS Take 1,000 Units by mouth daily.      Marland Kitchen FLUoxetine (PROZAC) 20 MG capsule Take 20 mg by mouth daily.      Marland Kitchen glipiZIDE (GLUCOTROL) 10 MG tablet Take 10 mg by mouth 2 (two) times daily before a meal.       . lipase/protease/amylase (CREON-12/PANCREASE) 12000 UNITS CPEP Take 1 capsule by mouth 3 (three) times daily with meals.       . Multiple Vitamins-Minerals (MULTIVITAMIN WITH MINERALS) tablet Take 1 tablet by mouth daily.      . nicotine (NICODERM CQ - DOSED IN MG/24 HOURS) 21 mg/24hr patch Place 21 mg onto the skin daily.        No Known Allergies  Past Medical History  Diagnosis Date  . Diabetes mellitus without complication   . Pancreatic disorder     Past Surgical History  Procedure Laterality Date  . Tonsillectomy      History   Social History  . Marital Status: Divorced    Spouse Name: N/A    Number of Children: 1  . Years of Education: N/A   Occupational History  . Not on file.   Social History Main Topics  . Smoking status: Former Smoker    Quit date: 03/21/2013  . Smokeless tobacco: Never Used  . Alcohol Use: 12.6 oz/week    21 Cans of beer per week  . Drug Use: No  . Sexual Activity: Not on file    Other Topics Concern  . Not on file   Social History Narrative  . No narrative on file    Family History  Problem Relation Age of Onset  . Breast cancer Mother   . CAD Father   . Cancer - Other Sister     Laryngeal    ROS:  no fevers or chills, productive cough, hemoptysis, dysphasia, odynophagia, melena, hematochezia, dysuria, hematuria, rash, seizure activity, claudication. Remaining systems are negative.  Physical Exam:   Blood pressure 132/88, pulse 88, temperature 97.2 F (36.2 C), temperature source Axillary, resp. rate 18, height _0  (1.753 m), weight 129 lb 9.6 oz (58.786 kg), SpO2 98.00%.  General:  Well developed/frail in NAD Skin warm/dry Patient not depressed No peripheral clubbing Back-normal HEENT-normal/normal eyelids Neck supple/normal carotid upstroke bilaterally; no bruits; no JVD; no thyromegaly chest - CTA/ normal expansion CV - RRR/normal S1 and S2; no murmurs, rubs or gallops;  PMI nondisplaced Abdomen -NT/ND, no HSM, no mass, + bowel sounds, no bruit 2+ femoral pulses, no bruits Ext-no edema, no chords, diminished distal pulses Neuro-grossly nonfocal; significant memory deficit and dementia.  ECG sinus rhythm, right bundle branch block.  Results for orders placed during the hospital encounter of 11/28/13 (from the past 48 hour(s))  TROPONIN  I     Status: None   Collection Time    11/28/13  7:20 PM      Result Value Ref Range   Troponin I <0.30  <0.30 ng/mL   Comment:            Due to the release kinetics of cTnI,     a negative result within the first hours     of the onset of symptoms does not rule out     myocardial infarction with certainty.     If myocardial infarction is still suspected,     repeat the test at appropriate intervals.  CBG MONITORING, ED     Status: None   Collection Time    11/28/13  9:35 PM      Result Value Ref Range   Glucose-Capillary 77  70 - 99 mg/dL  URINALYSIS, ROUTINE W REFLEX MICROSCOPIC     Status:  Abnormal   Collection Time    11/28/13 10:31 PM      Result Value Ref Range   Color, Urine YELLOW  YELLOW   APPearance CLEAR  CLEAR   Specific Gravity, Urine 1.009  1.005 - 1.030   pH 5.5  5.0 - 8.0   Glucose, UA NEGATIVE  NEGATIVE mg/dL   Hgb urine dipstick TRACE (*) NEGATIVE   Bilirubin Urine NEGATIVE  NEGATIVE   Ketones, ur NEGATIVE  NEGATIVE mg/dL   Protein, ur NEGATIVE  NEGATIVE mg/dL   Urobilinogen, UA 0.2  0.0 - 1.0 mg/dL   Nitrite NEGATIVE  NEGATIVE   Leukocytes, UA NEGATIVE  NEGATIVE  URINE MICROSCOPIC-ADD ON     Status: None   Collection Time    11/28/13 10:31 PM      Result Value Ref Range   Squamous Epithelial / LPF RARE  RARE   RBC / HPF 0-2  <3 RBC/hpf   Bacteria, UA RARE  RARE  BASIC METABOLIC PANEL     Status: Abnormal   Collection Time    11/28/13 10:35 PM      Result Value Ref Range   Sodium 131 (*) 137 - 147 mEq/L   Potassium 4.2  3.7 - 5.3 mEq/L   Chloride 91 (*) 96 - 112 mEq/L   CO2 27  19 - 32 mEq/L   Glucose, Bld 81  70 - 99 mg/dL   BUN 12  6 - 23 mg/dL   Creatinine, Ser 0.70  0.50 - 1.35 mg/dL   Calcium 9.3  8.4 - 10.5 mg/dL   GFR calc non Af Amer >90  >90 mL/min   GFR calc Af Amer >90  >90 mL/min   Comment: (NOTE)     The eGFR has been calculated using the CKD EPI equation.     This calculation has not been validated in all clinical situations.     eGFR's persistently <90 mL/min signify possible Chronic Kidney     Disease.  CBG MONITORING, ED     Status: None   Collection Time    11/28/13 10:37 PM      Result Value Ref Range   Glucose-Capillary 76  70 - 99 mg/dL  GLUCOSE, CAPILLARY     Status: None   Collection Time    11/28/13 11:55 PM      Result Value Ref Range   Glucose-Capillary 75  70 - 99 mg/dL   Comment 1 Documented in Chart    GLUCOSE, CAPILLARY     Status: Abnormal   Collection Time    11/29/13  6:47 AM      Result Value Ref Range   Glucose-Capillary 100 (*) 70 - 99 mg/dL   Comment 1 Notify RN    HEMOGLOBIN A1C     Status:  Abnormal   Collection Time    11/29/13  8:20 AM      Result Value Ref Range   Hemoglobin A1C 7.9 (*) <5.7 %   Comment: (NOTE)                                                                               According to the ADA Clinical Practice Recommendations for 2011, when     HbA1c is used as a screening test:      >=6.5%   Diagnostic of Diabetes Mellitus               (if abnormal result is confirmed)     5.7-6.4%   Increased risk of developing Diabetes Mellitus     References:Diagnosis and Classification of Diabetes Mellitus,Diabetes     JSHF,0263,78(HYIFO 1):S62-S69 and Standards of Medical Care in             Diabetes - 2011,Diabetes Care,2011,34 (Suppl 1):S11-S61.   Mean Plasma Glucose 180 (*) <117 mg/dL   Comment: Performed at [facility]  NA AND K (SODIUM & POTASSIUM), RAND UR     Status: None   Collection Time    11/29/13 11:00 AM      Result Value Ref Range   Sodium, Ur 116     Potassium Urine Timed 12    OSMOLALITY, URINE     Status: Abnormal   Collection Time    11/29/13 11:00 AM      Result Value Ref Range   Osmolality, Ur 280 (*) 390 - 1090 mOsm/kg   Comment: Performed at [place], CAPILLARY     Status: Abnormal   Collection Time    11/29/13 11:47 AM      Result Value Ref Range   Glucose-Capillary 159 (*) 70 - 99 mg/dL   Comment 1 Notify RN    GLUCOSE, CAPILLARY     Status: Abnormal   Collection Time    11/29/13  4:18 PM      Result Value Ref Range   Glucose-Capillary 64 (*) 70 - 99 mg/dL  CLOSTRIDIUM DIFFICILE BY PCR     Status: None   Collection Time    11/29/13  4:21 PM      Result Value Ref Range   C difficile by pcr NEGATIVE  NEGATIVE  GLUCOSE, CAPILLARY     Status: Abnormal   Collection Time    11/29/13  4:51 PM      Result Value Ref Range   Glucose-Capillary 108 (*) 70 - 99 mg/dL  GLUCOSE, CAPILLARY     Status: Abnormal   Collection Time    11/29/13  9:15 PM      Result Value Ref Range   Glucose-Capillary 138 (*)  70 - 99 mg/dL  BASIC METABOLIC PANEL     Status: Abnormal   Collection Time    11/30/13  4:11 AM      Result Value Ref Range   Sodium 130 (*)  137 - 147 mEq/L   Potassium 4.8  3.7 - 5.3 mEq/L   Chloride 90 (*) 96 - 112 mEq/L   CO2 27  19 - 32 mEq/L   Glucose, Bld 72  70 - 99 mg/dL   BUN 13  6 - 23 mg/dL   Creatinine, Ser 0.68  0.50 - 1.35 mg/dL   Calcium 9.2  8.4 - 10.5 mg/dL   GFR calc non Af Amer >90  >90 mL/min   GFR calc Af Amer >90  >90 mL/min   Comment: (NOTE)     The eGFR has been calculated using the CKD EPI equation.     This calculation has not been validated in all clinical situations.     eGFR's persistently <90 mL/min signify possible Chronic Kidney     Disease.  CBC     Status: Abnormal   Collection Time    11/30/13  4:11 AM      Result Value Ref Range   WBC 5.5  4.0 - 10.5 K/uL   RBC 3.73 (*) 4.22 - 5.81 MIL/uL   Hemoglobin 11.6 (*) 13.0 - 17.0 g/dL   HCT 32.5 (*) 39.0 - 52.0 %   MCV 87.1  78.0 - 100.0 fL   MCH 31.1  26.0 - 34.0 pg   MCHC 35.7  30.0 - 36.0 g/dL   RDW 12.7  11.5 - 15.5 %   Platelets 258  150 - 400 K/uL  TSH     Status: None   Collection Time    11/30/13  4:11 AM      Result Value Ref Range   TSH 3.430  0.350 - 4.500 uIU/mL   Comment: Please note change in reference range.  MAGNESIUM     Status: None   Collection Time    11/30/13  4:11 AM      Result Value Ref Range   Magnesium 1.7  1.5 - 2.5 mg/dL  PHOSPHORUS     Status: None   Collection Time    11/30/13  4:11 AM      Result Value Ref Range   Phosphorus 3.5  2.3 - 4.6 mg/dL  GLUCOSE, CAPILLARY     Status: None   Collection Time    11/30/13  6:00 AM      Result Value Ref Range   Glucose-Capillary 77  70 - 99 mg/dL    Dg Chest 2 View  11/28/2013   CLINICAL DATA:  Fatigue and muscle weakness.  EXAM: CHEST  2 VIEW  COMPARISON:  None.  FINDINGS: Lung volumes are low. There is bibasilar collapse/ consolidation with small bilateral pleural effusions. No evidence for overt pulmonary edema  at this time. The cardio pericardial silhouette is enlarged. Bones are diffusely demineralized. Telemetry leads overlie the chest.  IMPRESSION: Bilateral lower lobe collapse/consolidation with small to moderate bilateral pleural effusions.   Electronically Signed   By: Misty Stanley M.D.   On: 11/28/2013 20:24   Ct Chest W Contrast  11/29/2013   CLINICAL DATA:  73 year old male with shortness of breath and cough. Recent weight gain and lower extremity swelling. Pleural effusions. Initial encounter.  EXAM: CT CHEST WITH CONTRAST  TECHNIQUE: Multidetector CT imaging of the chest was performed during intravenous contrast administration.  CONTRAST:  79m OMNIPAQUE IOHEXOL 300 MG/ML  SOLN  COMPARISON:  Chest radiographs 11/28/2013.  FINDINGS: Moderate to large bilateral layering pleural effusions, the right is larger. Simple fluid densitometry suggesting transudate fluid. No pericardial effusion. Cardiomegaly. Widespread atherosclerotic plaque in the  aorta. Coronary artery involvement.  Compressive atelectasis in both lungs. There is also a 7 mm right middle lobe lung nodule on series 3, image 38. Major airways are patent.  No acute or suspicious osseous lesion. Mild chronic left lateral tenth rib fracture. Mild scoliosis.  No axillary or mediastinal lymphadenopathy.  Visualized upper abdomen remarkable for confluent dystrophic calcifications throughout the visible pancreas.  IMPRESSION: 1. Moderate to large right greater than left layering pleural effusions with compressive atelectasis. 2. Seven mm right middle lobe lung nodule (series 3, image 38). If the patient is at high risk for bronchogenic carcinoma, follow-up chest CT at 3-6 months is recommended. If the patient is at low risk for bronchogenic carcinoma, follow-up chest CT at 6-12 months is recommended. This recommendation follows the consensus statement: Guidelines for Management of Small Pulmonary Nodules Detected on CT Scans: A Statement from the [employer] as published in Radiology 2005; 237:395-400. 3. Chronic calcific pancreatitis.   Electronically Signed   By: Lars Pinks M.D.   On: 11/29/2013 13:54    Assessment/Plan 1 acute systolic congestive heart failure-the patient presented with volume excess and has improved with diuresis. Echocardiogram shows severe LV dysfunction with an ejection fraction of 15%. There is mild mitral regurgitation and moderate tricuspid regurgitation. I will change Lasix to 40 mg by mouth daily and follow renal function. He has been initiated on carvedilol and we will increase this as an outpatient as tolerated by pulse and blood pressure. Continue lisinopril but increase to 5 mg daily. Long discussion with patient's daughter and former wife. Cardiomyopathy may be secondary to alcohol but given diabetes mellitus coronary artery disease is also a possibility. Given his dementia I think he is a poor candidate for aggressive cardiac evaluation such as cardiac catheterization. They agree that medical therapy is indicated with no invasive procedures. 2 diabetes mellitus-management per primary care.  3 history of alcohol abuse.  4 dementia  Kirk Ruths MD 11/30/2013, 9:44 AM

## 2013-11-30 NOTE — Evaluation (Signed)
Physical Therapy Evaluation Patient Details Name: [name] EmoryClyde [name] MRN: [id number] DOB: 1940/10/30 Today's Date: 11/30/2013   History of Present Illness  73 year old male with no prior cardiac history for evaluation of acute systolic congestive heart failure. Patient has a long history of alcohol abuse as well as diabetes mellitus. Pt has dementia.   Clinical Impression  Pt adm due to the above. Presents with decreased independence with mobility secondary to cognitive deficits and balance deficits. Pt is a high fall risk due to deficits listed below. Pt to benefit from skilled acute PT to address deficits and maximize independence. Pt will require 24/7 (A) upon acute D/C; unsure if ALF can provide (A) pt needs at this time. Spoke with family and will recommend SNF for post acute rehab. CSW notified.    Follow Up Recommendations SNF;Supervision/Assistance - 24 hour    Equipment Recommendations  Rolling walker with 5" wheels    Recommendations for Other Services       Precautions / Restrictions Precautions Precautions: Fall Precaution Comments: reports he has fallen at ALF Restrictions Weight Bearing Restrictions: No      Mobility  Bed Mobility Overal bed mobility: Modified Independent                Transfers Overall transfer level: Needs assistance Equipment used: 1 person hand held assist Transfers: Sit to/from Stand Sit to Stand: Min assist         General transfer comment: pt very unsteady with transfers; requires (A) to maintain balance and cues for safety   Ambulation/Gait Ambulation/Gait assistance: Mod assist Ambulation Distance (Feet): 140 Feet Assistive device: 1 person hand held assist Gait Pattern/deviations: Wide base of support;Trunk flexed;Staggering right;Staggering left;Decreased stride length;Step-through pattern Gait velocity: decreased; unsafe to increase    General Gait Details: pt very unsteady with gt; multiple balance losses and seemed  unaware of balance deficits; required mod (A) to maintain balance; pt very fatigued at end of session; decr activity tolerance  Stairs            Wheelchair Mobility    Modified Rankin (Stroke Patients Only)       Balance Overall balance assessment: Needs assistance;History of Falls Sitting-balance support: Feet supported;No upper extremity supported Sitting balance-Leahy Scale: Good     Standing balance support: During functional activity;Single extremity supported Standing balance-Leahy Scale: Poor Standing balance comment: requires UE support; +sway                              Pertinent Vitals/Pain No c/o pain    Home Living Family/patient expects to be discharged to:: Skilled nursing facility               Home Equipment: Dan HumphreysWalker - 2 wheels;Cane - single point Additional Comments: pt was living at ALF; has room with handicap bathroom     Prior Function Level of Independence: Needs assistance   Gait / Transfers Assistance Needed: ambulating independently to/from dining room  ADL's / Homemaking Assistance Needed: ALF provides meals  Comments: family concerend about pt and his bathing habits due to lack of (A); reports ALF can (A) with bathing; unsure as to how much (A) they can provide     Hand Dominance        Extremity/Trunk Assessment   Upper Extremity Assessment: Defer to OT evaluation           Lower Extremity Assessment: Overall WFL for tasks assessed;LLE deficits/detail;RLE deficits/detail RLE Deficits / Details:  strength WFL  LLE Deficits / Details: strength WFL  Cervical / Trunk Assessment: Normal  Communication   Communication: No difficulties  Cognition Arousal/Alertness: Awake/alert Behavior During Therapy: Impulsive Overall Cognitive Status: Impaired/Different from baseline Area of Impairment: Orientation;Attention;Following commands;Awareness;Safety/judgement;Problem solving Orientation Level: Disoriented  to;Situation;Place Current Attention Level: Selective Memory: Decreased short-term memory Following Commands: Follows one step commands with increased time Safety/Judgement: Decreased awareness of deficits;Decreased awareness of safety Awareness: Emergent Problem Solving: Slow processing;Difficulty sequencing;Requires verbal cues;Requires tactile cues General Comments: pt with history of dementia; family reports it has been worsening     General Comments General comments (skin integrity, edema, etc.): extensive discussion with family and pt; recommend SNF     Exercises        Assessment/Plan    PT Assessment Patient needs continued PT services  PT Diagnosis Abnormality of gait   PT Problem List Decreased activity tolerance;Decreased balance;Decreased mobility;Decreased safety awareness;Decreased cognition;Impaired sensation  PT Treatment Interventions DME instruction;Gait training;Functional mobility training;Therapeutic exercise;Therapeutic activities;Balance training;Neuromuscular re-education;Patient/family education   PT Goals (Current goals can be found in the Care Plan section) Acute Rehab PT Goals Patient Stated Goal: to go back to my room i just moved into PT Goal Formulation: With patient/family Time For Goal Achievement: 12/14/13 Potential to Achieve Goals: Fair    Frequency Min 2X/week   Barriers to discharge Decreased caregiver support was living at ALF    Co-evaluation               End of Session Equipment Utilized During Treatment: Gait belt Activity Tolerance: Patient tolerated treatment well Patient left: in bed;with call bell/phone within reach;with family/visitor present Nurse Communication: Mobility status;Other (comment) (D/C recommendation )         Time: 4827-0786 PT Time Calculation (min): 20 min   Charges:   PT Evaluation $Initial PT Evaluation Tier I: 1 Procedure PT Treatments $Gait Training: 8-22 mins   PT G CodesDonell Sievert, [place] [phone] 11/30/2013, 4:33 PM

## 2013-11-30 NOTE — Progress Notes (Signed)
Inpatient Diabetes Program Recommendations  AACE/ADA: New Consensus Statement on Inpatient Glycemic Control (2013)  Target Ranges:  Prepandial:   less than 140 mg/dL      Peak postprandial:   less than 180 mg/dL (1-2 hours)      Critically ill patients:  140 - 180 mg/dL     Results for [name] [name], [name] [name] (MRN [id number]) as of 11/30/2013 08:54  Ref. Range 11/29/2013 06:47 11/29/2013 11:47 11/29/2013 16:18 11/29/2013 16:51 11/29/2013 21:15  Glucose-Capillary Latest Range: 70-99 mg/dL 195 (H) 093 (H) 64 (L) 108 (H) 138 (H)    Results for [name] [name], [name] [name] (MRN [id number]) as of 11/30/2013 08:54  Ref. Range 11/30/2013 06:00  Glucose-Capillary Latest Range: 70-99 mg/dL 77    Results for [name] [name], [name] [name] (MRN [id number]) as of 11/30/2013 08:54  Ref. Range 11/29/2013 08:20  Hemoglobin A1C Latest Range: <5.7 % 7.9 (H)    Admitted with SOB and LE Edema.  +History of DM2, Dementia.  From ALF.  Diabetes history: Type 2 DM Home DM Meds: Glipizide 10 mg bid + Metformin? + Actos?   **Mild hypoglycemia yesterday at 4pm.  Patient currently receiving Glipizide 10 mg bid + Novolog Sensitive SSI tid ac + HS   **MD- If patient continues to have hypoglycemia, may want to reduce Glipizide dose to 5 mg bid   Will follow. Ambrose Finland RN, MSN, CDE Diabetes Coordinator Inpatient Diabetes Program Team Pager: [phone] (8a-10p)

## 2013-11-30 NOTE — Clinical Social Work Placement (Addendum)
     Clinical Social Work Department CLINICAL SOCIAL WORK PLACEMENT NOTE 12/04/2013  Patient:  [name] [name], [name] [name]  Account Number:  [id number] Admit date:  11/28/2013  Clinical Social Worker:  Lupita Leash Elodie Panameno, LCSWA  Date/time:  11/30/2013 04:44 PM  Clinical Social Work is seeking post-discharge placement for this patient at the following level of care:   SKILLED NURSING   (*CSW will update this form in Epic as items are completed)   11/30/2013  Patient/family provided with [facility] Department of Clinical Social Works list of facilities offering this level of care within the geographic area requested by the patient (or if unable, by the patients family).  11/30/2013  Patient/family informed of their freedom to choose among providers that offer the needed level of care, that participate in Medicare, Medicaid or managed care program needed by the patient, have an available bed and are willing to accept the patient.  11/30/2013  Patient/family informed of [facility] ownership interest in [facility], as well as of the fact that they are under no obligation to receive care at this facility.  PASARR submitted to EDS on 11/30/2013 PASARR number received from EDS on 11/30/2013  FL2 transmitted to all facilities in geographic area requested by pt/family on  11/30/2013 FL2 transmitted to all facilities within larger geographic area on   Patient informed that his/her managed care company has contracts with or will negotiate with  certain facilities, including the following:   NA     Patient/family informed of bed offers received:  12/03/2013 Patient chooses bed at [facility] Physician recommends and patient chooses bed at    Patient to be transferred to [facility] on  12/03/2013 Patient to be transferred to facility by Ambulance  Sharin Mons)  The following physician request were entered in Epic:   Additional Comments: 12/03/13  DC to  SNF today.  Ok per patient's daughter- patient remains confused.  Nusing notified and called report to facility.  No further CSW needs identified. CSW signing off. Lorri Frederick. Averi Cacioppo, LCSWA [phone]

## 2013-12-01 DIAGNOSIS — E43 Unspecified severe protein-calorie malnutrition: Secondary | ICD-10-CM | POA: Diagnosis present

## 2013-12-01 DIAGNOSIS — J90 Pleural effusion, not elsewhere classified: Secondary | ICD-10-CM

## 2013-12-01 DIAGNOSIS — R911 Solitary pulmonary nodule: Secondary | ICD-10-CM

## 2013-12-01 DIAGNOSIS — I5021 Acute systolic (congestive) heart failure: Principal | ICD-10-CM

## 2013-12-01 HISTORY — DX: Unspecified severe protein-calorie malnutrition: E43

## 2013-12-01 LAB — CBC
HCT: 31.7 % — ABNORMAL LOW (ref 39.0–52.0)
HEMOGLOBIN: 11.4 g/dL — AB (ref 13.0–17.0)
MCH: 31 pg (ref 26.0–34.0)
MCHC: 36 g/dL (ref 30.0–36.0)
MCV: 86.1 fL (ref 78.0–100.0)
PLATELETS: 230 10*3/uL (ref 150–400)
RBC: 3.68 MIL/uL — AB (ref 4.22–5.81)
RDW: 12.6 % (ref 11.5–15.5)
WBC: 4.7 10*3/uL (ref 4.0–10.5)

## 2013-12-01 LAB — BASIC METABOLIC PANEL
BUN: 13 mg/dL (ref 6–23)
CALCIUM: 8.2 mg/dL — AB (ref 8.4–10.5)
CO2: 24 meq/L (ref 19–32)
Chloride: 87 mEq/L — ABNORMAL LOW (ref 96–112)
Creatinine, Ser: 0.56 mg/dL (ref 0.50–1.35)
GFR calc Af Amer: >90 mL/min (ref >90)
GFR calc non Af Amer: >90 mL/min (ref >90)
GLUCOSE: 128 mg/dL — AB (ref 70–99)
POTASSIUM: 4.2 meq/L (ref 3.7–5.3)
SODIUM: 127 meq/L — AB (ref 137–147)

## 2013-12-01 LAB — GLUCOSE, CAPILLARY
GLUCOSE-CAPILLARY: 142 mg/dL — AB (ref 70–99)
GLUCOSE-CAPILLARY: 223 mg/dL — AB (ref 70–99)
GLUCOSE-CAPILLARY: 68 mg/dL — AB (ref 70–99)
Glucose-Capillary: 120 mg/dL — ABNORMAL HIGH (ref 70–99)
Glucose-Capillary: 188 mg/dL — ABNORMAL HIGH (ref 70–99)
Glucose-Capillary: 88 mg/dL (ref 70–99)

## 2013-12-01 LAB — PRO B NATRIURETIC PEPTIDE: Pro B Natriuretic peptide (BNP): 9675 pg/mL — ABNORMAL HIGH (ref 0–125)

## 2013-12-01 MED ORDER — SPIRONOLACTONE 12.5 MG HALF TABLET
12.5000 mg | ORAL_TABLET | Freq: Every day | ORAL | Status: DC
  Administered 2013-12-01 – 2013-12-03 (×3): 12.5 mg via ORAL
  Filled 2013-12-01 (×3): qty 1

## 2013-12-01 MED ORDER — TAMSULOSIN HCL 0.4 MG PO CAPS
0.4000 mg | ORAL_CAPSULE | Freq: Every day | ORAL | Status: DC
  Administered 2013-12-01 – 2013-12-03 (×3): 0.4 mg via ORAL
  Filled 2013-12-01 (×4): qty 1

## 2013-12-01 NOTE — Progress Notes (Signed)
Patient ID: [name] [name], male   DOB: 08-24-1941, 73 y.o.   MRN: [id number]     Subjective:    Breathing has improved today   Objective:   Temp:  [97.4 F (36.3 C)-97.5 F (36.4 C)] 97.4 F (36.3 C) (04/04 0437) Pulse Rate:  [75-77] 75 (04/04 0437) Resp:  [18-20] 20 (04/04 0437) BP: (115-130)/(64-84) 130/76 mmHg (04/04 0437) SpO2:  [93 %-100 %] 100 % (04/04 0437) Weight:  [121 lb 3.2 oz (54.976 kg)] 121 lb 3.2 oz (54.976 kg) (04/04 0437) Last BM Date: 11/30/13  Filed Weights   11/29/13 0259 11/30/13 0406 12/01/13 0437  Weight: 135 lb 4.8 oz (61.372 kg) 129 lb 9.6 oz (58.786 kg) 121 lb 3.2 oz (54.976 kg)    Intake/Output Summary (Last 24 hours) at 12/01/13 1022 Last data filed at 11/30/13 2000  Gross per 24 hour  Intake    360 ml  Output      0 ml  Net    360 ml    Telemetry: NSR  Exam:  General:NAD  Resp: CTAB  Cardiac: RRR, no m/r/g, no JVD  GI: abdomen soft, NT, ND  MSK: extremities are warm, no edema   Lab Results:  Basic Metabolic Panel:  Recent Labs Lab 11/28/13 2235 11/30/13 0411 12/01/13 0328  NA 131* 130* 127*  K 4.2 4.8 4.2  CL 91* 90* 87*  CO2 27 27 24   GLUCOSE 81 72 128*  BUN 12 13 13   CREATININE 0.70 0.68 0.56  CALCIUM 9.3 9.2 8.2*  MG  --  1.7  --     Liver Function Tests: No results found for this basename: AST, ALT, ALKPHOS, BILITOT, PROT, ALBUMIN,  in the last 168 hours  CBC:  Recent Labs Lab 11/30/13 0411 12/01/13 0328  WBC 5.5 4.7  HGB 11.6* 11.4*  HCT 32.5* 31.7*  MCV 87.1 86.1  PLT 258 230    Cardiac Enzymes:  Recent Labs Lab 11/28/13 1920  TROPONINI <0.30    BNP:  Recent Labs  12/01/13 0328  PROBNP 9675.0*    Coagulation: No results found for this basename: INR,  in the last 168 hours  ECG:   Medications:   Scheduled Medications: . aspirin EC  81 mg Oral Daily  . carvedilol  3.125 mg Oral BID WC  . enoxaparin (LOVENOX) injection  30 mg Subcutaneous Q24H  . FLUoxetine  20 mg Oral Daily    . glipiZIDE  10 mg Oral QAC breakfast  . insulin aspart  0-5 Units Subcutaneous QHS  . insulin aspart  0-9 Units Subcutaneous TID WC  . lipase/protease/amylase  1 capsule Oral TID AC  . lisinopril  5 mg Oral Daily  . multivitamin with minerals  1 tablet Oral Daily  . nicotine  21 mg Transdermal Q24H  . simvastatin  20 mg Oral q1800  . sodium chloride  3 mL Intravenous Q12H  . spironolactone  12.5 mg Oral Daily  . tamsulosin  0.4 mg Oral QPC breakfast  . vitamin B-1  250 mg Oral Daily  . Vitamin D (Ergocalciferol)  50,000 Units Oral Q Fri     Infusions:     PRN Medications:  sodium chloride, sodium chloride     Assessment/Plan    73 yo male with hx of DM, EtOH abuse admitted with SOB, DOE, LE edema, and orthopnea.   1. Acute systolic heart failure - echo 1/3/084/2/15 LVEF 15%, diffuse hypokinesis, grade II diastolic dysfunction. Severe RV dysfunction. PASP 49 - he is + 600mL  yesterday, net negative 1.9 liters since admission. Currently not on diuretic. He appears euvolemic, his current hyponatremia may be related to diuresis.   - he is on low dose beta blocker and ACE-I that can be further titrated overtime as outpatient. Started on low dose sprironolactone.  - given history of significant dementia will defer on pursuing ischemic evaluation at this time, work to optimize medical management.      Dina Rich, M.D., F.A.C.C.

## 2013-12-01 NOTE — Progress Notes (Signed)
Hypoglycemic Event  CBG: 17  Treatment: D50 IV 50 mL  Symptoms: Pale and Sweaty  Follow-up CBG: Time:2230 CBG Result:120  Possible Reasons for Event:   Comments/MD notified:rapid response and triad hospitalist notified. Total of 2 amp d50 given    Ram Haugan, [name] [name]  Remember to initiate Hypoglycemia Order Set & complete

## 2013-12-01 NOTE — Progress Notes (Addendum)
[facility] PROGRESS NOTE Interim History: 73 year old male with no prior cardiac history for evaluation of acute systolic congestive heart failure. Patient has a long history of alcohol abuse as well as diabetes mellitus. He resides in an assisted living facility. His daughter recently noticed that he had increased weight gain, bilateral lower extremity edema and dyspnea on exertion. There is orthopnea. He has had some back pain but no chest pain. He was sent to the emergency room and has improved with diuresis.  Assessment/Plan: Acute systolic CHF (congestive heart failure), NYHA class 2/Pleural effusion - possible due to ETOH abuse. - On IV lasix, mod UOP. -JVD no lower extremity edema.No PND. - Strict I and os' daily weights. - Monitor electrolytes replete as needed. - On lasix, ACE-I and coreg. May benefit from low dose spironolactone.  Hyponatremia: - due to HF.  Incidental lung nodule, > 81mm and < 98mm - follow up with pulmonary as an outpatient.  Hypertension - stable, see HF.  Type II or unspecified type diabetes mellitus without mention of complication, not stated as uncontrolled - cont glipizide stable.  Weakness generalized - PT consult   Severe protein caloric malnutrition:   Code Status: DO NOT RESUSCITATE  Family Communication: Daughter at Bedside  Disposition Plan: Inpatient    Consultants:  Cardiology  Procedures: ECHO 4.2.2015: estimated ejection fraction was 15%. Diffuse hypokinesis. Features are consistent with a pseudonormal left ventricular filling pattern, with concomitant abnormal relaxation and increased filling Pressure.    Antibiotics:  none  HPI/Subjective: No ocmplains  Objective: Filed Vitals:   11/30/13 1802 11/30/13 2152 11/30/13 2206 12/01/13 0437  BP: 122/84 122/64  130/76  Pulse: 77   75  Temp:    97.4 F (36.3 C)  TempSrc:    Oral  Resp:    20  Height:      Weight:    54.976 kg (121 lb 3.2 oz)  SpO2:  93%  100% 100%    Intake/Output Summary (Last 24 hours) at 12/01/13 0742 Last data filed at 11/30/13 2000  Gross per 24 hour  Intake    600 ml  Output      0 ml  Net    600 ml   Filed Weights   11/29/13 0259 11/30/13 0406 12/01/13 0437  Weight: 61.372 kg (135 lb 4.8 oz) 58.786 kg (129 lb 9.6 oz) 54.976 kg (121 lb 3.2 oz)    Exam:  General: Alert, awake, oriented x3, in no acute distress.  HEENT: No bruits, no goiter. -JVD Heart: Regular rate and rhythm, without murmurs, rubs, gallops.  Lungs: Good air movement, clear Abdomen: Soft, nontender, nondistended, positive bowel sounds.   Data Reviewed: Basic Metabolic Panel:  Recent Labs Lab 11/28/13 2235 11/30/13 0411 12/01/13 0328  NA 131* 130* 127*  K 4.2 4.8 4.2  CL 91* 90* 87*  CO2 27 27 24   GLUCOSE 81 72 128*  BUN 12 13 13   CREATININE 0.70 0.68 0.56  CALCIUM 9.3 9.2 8.2*  MG  --  1.7  --   PHOS  --  3.5  --    Liver Function Tests: No results found for this basename: AST, ALT, ALKPHOS, BILITOT, PROT, ALBUMIN,  in the last 168 hours No results found for this basename: LIPASE, AMYLASE,  in the last 168 hours No results found for this basename: AMMONIA,  in the last 168 hours CBC:  Recent Labs Lab 11/30/13 0411 12/01/13 0328  WBC 5.5 4.7  HGB 11.6* 11.4*  HCT 32.5* 31.7*  MCV 87.1 86.1  PLT 258 230   Cardiac Enzymes:  Recent Labs Lab 11/28/13 1920  TROPONINI <0.30   BNP (last 3 results)  Recent Labs  12/01/13 0328  PROBNP 9675.0*   CBG:  Recent Labs Lab 11/30/13 2155 11/30/13 2201 11/30/13 2219 12/01/13 0327 12/01/13 0615  GLUCAP 43* 48* 120* 142* 120*    Recent Results (from the past 240 hour(s))  CLOSTRIDIUM DIFFICILE BY PCR     Status: None   Collection Time    11/29/13  4:21 PM      Result Value Ref Range Status   C difficile by pcr NEGATIVE  NEGATIVE Final     Studies: Ct Chest W Contrast  11/29/2013   CLINICAL DATA:  73 year old male with shortness of breath and cough. Recent  weight gain and lower extremity swelling. Pleural effusions. Initial encounter.  EXAM: CT CHEST WITH CONTRAST  TECHNIQUE: Multidetector CT imaging of the chest was performed during intravenous contrast administration.  CONTRAST:  75mL OMNIPAQUE IOHEXOL 300 MG/ML  SOLN  COMPARISON:  Chest radiographs 11/28/2013.  FINDINGS: Moderate to large bilateral layering pleural effusions, the right is larger. Simple fluid densitometry suggesting transudate fluid. No pericardial effusion. Cardiomegaly. Widespread atherosclerotic plaque in the aorta. Coronary artery involvement.  Compressive atelectasis in both lungs. There is also a 7 mm right middle lobe lung nodule on series 3, image 38. Major airways are patent.  No acute or suspicious osseous lesion. Mild chronic left lateral tenth rib fracture. Mild scoliosis.  No axillary or mediastinal lymphadenopathy.  Visualized upper abdomen remarkable for confluent dystrophic calcifications throughout the visible pancreas.  IMPRESSION: 1. Moderate to large right greater than left layering pleural effusions with compressive atelectasis. 2. Seven mm right middle lobe lung nodule (series 3, image 38). If the patient is at high risk for bronchogenic carcinoma, follow-up chest CT at 3-6 months is recommended. If the patient is at low risk for bronchogenic carcinoma, follow-up chest CT at 6-12 months is recommended. This recommendation follows the consensus statement: Guidelines for Management of Small Pulmonary Nodules Detected on CT Scans: A Statement from the Fleischner Society as published in Radiology 2005; 237:395-400. 3. Chronic calcific pancreatitis.   Electronically Signed   By: Augusto GambleLee  Hall M.D.   On: 11/29/2013 13:54    Scheduled Meds: . aspirin EC  81 mg Oral Daily  . carvedilol  3.125 mg Oral BID WC  . enoxaparin (LOVENOX) injection  30 mg Subcutaneous Q24H  . FLUoxetine  20 mg Oral Daily  . glipiZIDE  10 mg Oral QAC breakfast  . insulin aspart  0-5 Units Subcutaneous QHS   . insulin aspart  0-9 Units Subcutaneous TID WC  . lipase/protease/amylase  1 capsule Oral TID AC  . lisinopril  5 mg Oral Daily  . multivitamin with minerals  1 tablet Oral Daily  . nicotine  21 mg Transdermal Q24H  . simvastatin  20 mg Oral q1800  . sodium chloride  3 mL Intravenous Q12H  . vitamin B-1  250 mg Oral Daily  . Vitamin D (Ergocalciferol)  50,000 Units Oral Q Fri   Continuous Infusions:    Marinda ElkFELIZ ORTIZ, ABRAHAM  [facility] Pager [phone]. If 8PM-8AM, please contact night-coverage at [email or web address], password [email or web address] 12/01/2013, 7:42 AM  LOS: 3 days

## 2013-12-01 NOTE — Progress Notes (Signed)
Pt noted disoriented and diaphoretic. BS=17. D50 1 amp given. Rapid response made aware. VS taken .rechecked BS after 15 =48 . Another 1 amp D50 given. After min BS rechecked 120 mg/dl. Pt responding well to his name and knows where he is. Notified [facility]ad hospitalist re hypoglycemic episode. Monitored pt closely.

## 2013-12-02 DIAGNOSIS — E43 Unspecified severe protein-calorie malnutrition: Secondary | ICD-10-CM

## 2013-12-02 LAB — GLUCOSE, CAPILLARY
GLUCOSE-CAPILLARY: 87 mg/dL (ref 70–99)
Glucose-Capillary: 151 mg/dL — ABNORMAL HIGH (ref 70–99)
Glucose-Capillary: 182 mg/dL — ABNORMAL HIGH (ref 70–99)
Glucose-Capillary: 193 mg/dL — ABNORMAL HIGH (ref 70–99)
Glucose-Capillary: 204 mg/dL — ABNORMAL HIGH (ref 70–99)
Glucose-Capillary: 70 mg/dL (ref 70–99)

## 2013-12-02 LAB — BASIC METABOLIC PANEL
BUN: 18 mg/dL (ref 6–23)
BUN: 20 mg/dL (ref 6–23)
CALCIUM: 8.2 mg/dL — AB (ref 8.4–10.5)
CHLORIDE: 85 meq/L — AB (ref 96–112)
CO2: 26 mEq/L (ref 19–32)
CO2: 27 mEq/L (ref 19–32)
CREATININE: 0.69 mg/dL (ref 0.50–1.35)
Calcium: 8.7 mg/dL (ref 8.4–10.5)
Chloride: 87 mEq/L — ABNORMAL LOW (ref 96–112)
Creatinine, Ser: 0.64 mg/dL (ref 0.50–1.35)
GFR calc non Af Amer: >90 mL/min (ref >90)
Glucose, Bld: 105 mg/dL — ABNORMAL HIGH (ref 70–99)
Glucose, Bld: 126 mg/dL — ABNORMAL HIGH (ref 70–99)
Potassium: 4.8 mEq/L (ref 3.7–5.3)
Potassium: 4.4 mEq/L (ref 3.7–5.3)
Sodium: 124 mEq/L — ABNORMAL LOW (ref 137–147)
Sodium: 125 mEq/L — ABNORMAL LOW (ref 137–147)

## 2013-12-02 LAB — VITAMIN B12: VITAMIN B 12: 800 pg/mL (ref 211–911)

## 2013-12-02 LAB — TSH: TSH: 4.41 u[IU]/mL (ref 0.350–4.500)

## 2013-12-02 MED ORDER — FUROSEMIDE 20 MG PO TABS
20.0000 mg | ORAL_TABLET | Freq: Every day | ORAL | Status: DC
  Administered 2013-12-02 – 2013-12-03 (×2): 20 mg via ORAL
  Filled 2013-12-02 (×3): qty 1

## 2013-12-02 MED ORDER — PREGABALIN 25 MG PO CAPS
50.0000 mg | ORAL_CAPSULE | Freq: Three times a day (TID) | ORAL | Status: DC
  Administered 2013-12-02 – 2013-12-03 (×4): 50 mg via ORAL
  Filled 2013-12-02 (×4): qty 2

## 2013-12-02 NOTE — Progress Notes (Signed)
Patient alert and oriented x2-4 throughout shift, intermittently disoriented to time and situation.  Patient denied any pain, nausea, or shortness of breath throughout shift.  Family at bedside throughout day.  MD notified as patient experiencing intermittent neuropathic pain in feet, increasing over past month.  Lyrica ordered and family updated.  Patient encouraged to void in urinal or in hat in toilet, but continues to refuse both despite continued encouragement.  Family and patient aware of plan to collect urine specimen and the need to measure input and output.  Urinal at bedside.  Patient resting comfortably.  Will continue to monitor.

## 2013-12-02 NOTE — Progress Notes (Addendum)
[facility] PROGRESS NOTE Interim History: 73 year old male with no prior cardiac history for evaluation of acute systolic congestive heart failure. Patient has a long history of alcohol abuse as well as diabetes mellitus. He resides in an assisted living facility. His daughter recently noticed that he had increased weight gain, bilateral lower extremity edema and dyspnea on exertion. There is orthopnea. He has had some back pain but no chest pain. He was sent to the emergency room and has improved with diuresis.  Assessment/Plan: Acute systolic CHF (congestive heart failure), NYHA class 2/Pleural effusion - possible due to ETOH abuse. - On IV lasix, mod UOP. -JVD no lower extremity edema. - Strict I and os' daily weights. - Monitor electrolytes replete as needed. - On lasix, ACE-I and coreg. May benefit from low dose spironolactone.  Hyponatremia: - Due to HF.  - Urine osmo and serum low, while hypervolemic hyponatremia. - now with euvolemic hyponatremia, will fluid restricted him check urine sodium and cr. - check orthostatic, TSH b12 and cortisol.  Diabetic neuropathy: - started Lyrica.  Incidental lung nodule, > 3mm and < 8mm - follow up with pulmonary as an outpatient.  Hypertension - stable, see HF.  Type II or unspecified type diabetes mellitus without mention of complication, not stated as uncontrolled - cont glipizide stable.  Weakness generalized - PT consult   Severe protein caloric malnutrition:   Code Status: DO NOT RESUSCITATE  Family Communication: Daughter at Bedside  Disposition Plan: Inpatient    Consultants:  Cardiology  Procedures: ECHO 4.2.2015: estimated ejection fraction was 15%. Diffuse hypokinesis. Features are consistent with a pseudonormal left ventricular filling pattern, with concomitant abnormal relaxation and increased filling Pressure.    Antibiotics:  none  HPI/Subjective: No complains  Objective: Filed Vitals:     12/01/13 1430 12/01/13 1813 12/01/13 2116 12/02/13 0510  BP: 104/60 106/70 90/56 106/67  Pulse: 73 83 68 78  Temp: 97.5 F (36.4 C)  97.5 F (36.4 C) 97.3 F (36.3 C)  TempSrc: Oral  Oral Oral  Resp: 20  18 18   Height:      Weight:    56.972 kg (125 lb 9.6 oz)  SpO2: 94%  99% 100%    Intake/Output Summary (Last 24 hours) at 12/02/13 0915 Last data filed at 12/01/13 1300  Gross per 24 hour  Intake    240 ml  Output      0 ml  Net    240 ml   Filed Weights   11/30/13 0406 12/01/13 0437 12/02/13 0510  Weight: 58.786 kg (129 lb 9.6 oz) 54.976 kg (121 lb 3.2 oz) 56.972 kg (125 lb 9.6 oz)    Exam:  General: Alert, awake, oriented x3, in no acute distress.  HEENT: No bruits, no goiter. -JVD Heart: Regular rate and rhythm, without murmurs, rubs, gallops.  Lungs: Good air movement, clear Abdomen: Soft, nontender, nondistended, positive bowel sounds.   Data Reviewed: Basic Metabolic Panel:  Recent Labs Lab 11/28/13 2235 11/30/13 0411 12/01/13 0328 12/02/13 0508  NA 131* 130* 127* 124*  K 4.2 4.8 4.2 4.8  CL 91* 90* 87* 87*  CO2 27 27 24 26   GLUCOSE 81 72 128* 105*  BUN 12 13 13 18   CREATININE 0.70 0.68 0.56 0.64  CALCIUM 9.3 9.2 8.2* 8.2*  MG  --  1.7  --   --   PHOS  --  3.5  --   --    Liver Function Tests: No results found for this basename: AST,  ALT, ALKPHOS, BILITOT, PROT, ALBUMIN,  in the last 168 hours No results found for this basename: LIPASE, AMYLASE,  in the last 168 hours No results found for this basename: AMMONIA,  in the last 168 hours CBC:  Recent Labs Lab 11/30/13 0411 12/01/13 0328  WBC 5.5 4.7  HGB 11.6* 11.4*  HCT 32.5* 31.7*  MCV 87.1 86.1  PLT 258 230   Cardiac Enzymes:  Recent Labs Lab 11/28/13 1920  TROPONINI <0.30   BNP (last 3 results)  Recent Labs  12/01/13 0328  PROBNP 9675.0*   CBG:  Recent Labs Lab 12/01/13 2111 12/01/13 2210 12/02/13 0015 12/02/13 0200 12/02/13 0659  GLUCAP 68* 88 204* 182* 87     Recent Results (from the past 240 hour(s))  CLOSTRIDIUM DIFFICILE BY PCR     Status: None   Collection Time    11/29/13  4:21 PM      Result Value Ref Range Status   C difficile by pcr NEGATIVE  NEGATIVE Final     Studies: No results found.  Scheduled Meds: . aspirin EC  81 mg Oral Daily  . carvedilol  3.125 mg Oral BID WC  . enoxaparin (LOVENOX) injection  30 mg Subcutaneous Q24H  . FLUoxetine  20 mg Oral Daily  . furosemide  20 mg Oral Daily  . insulin aspart  0-5 Units Subcutaneous QHS  . insulin aspart  0-9 Units Subcutaneous TID WC  . lipase/protease/amylase  1 capsule Oral TID AC  . lisinopril  5 mg Oral Daily  . multivitamin with minerals  1 tablet Oral Daily  . nicotine  21 mg Transdermal Q24H  . simvastatin  20 mg Oral q1800  . sodium chloride  3 mL Intravenous Q12H  . spironolactone  12.5 mg Oral Daily  . tamsulosin  0.4 mg Oral QPC breakfast  . vitamin B-1  250 mg Oral Daily  . Vitamin D (Ergocalciferol)  50,000 Units Oral Q Fri   Continuous Infusions:    Marinda Elk  [facility] Pager [phone]. If 8PM-8AM, please contact night-coverage at [email or web address], password [email or web address] 12/02/2013, 9:15 AM  LOS: 4 days

## 2013-12-02 NOTE — Progress Notes (Signed)
Patient had a HS CBG of 68. Patient alert and oriented. Snacks and orange juice were given to patient. Next cbg 88.

## 2013-12-02 NOTE — Progress Notes (Signed)
Patient ID: [name] [name], male   DOB: 09/15/1940, 73 y.o.   MRN: [id number]     Subjective:    No complaints this morning  Objective:   Temp:  [97.3 F (36.3 C)-97.5 F (36.4 C)] 97.3 F (36.3 C) (04/05 0510) Pulse Rate:  [68-83] 78 (04/05 0510) Resp:  [18-20] 18 (04/05 0510) BP: (90-121)/(56-78) 106/67 mmHg (04/05 0510) SpO2:  [94 %-100 %] 100 % (04/05 0510) Weight:  [125 lb 9.6 oz (56.972 kg)] 125 lb 9.6 oz (56.972 kg) (04/05 0510) Last BM Date: 12/01/13  Filed Weights   11/30/13 0406 12/01/13 0437 12/02/13 0510  Weight: 129 lb 9.6 oz (58.786 kg) 121 lb 3.2 oz (54.976 kg) 125 lb 9.6 oz (56.972 kg)    Intake/Output Summary (Last 24 hours) at 12/02/13 1005 Last data filed at 12/01/13 1300  Gross per 24 hour  Intake    240 ml  Output      0 ml  Net    240 ml    Telemetry: NSR  Exam:  General: NAD  Resp: CTAB  Cardiac: RRR, no m/r/g, no JVD  XB:[id number]:abdomen soft, NT, ND  MSK: no LE edema    Lab Results:  Basic Metabolic Panel:  Recent Labs Lab 11/28/13 2235 11/30/13 0411 12/01/13 0328 12/02/13 0508  NA 131* 130* 127* 124*  K 4.2 4.8 4.2 4.8  CL 91* 90* 87* 87*  CO2 27 27 24 26   GLUCOSE 81 72 128* 105*  BUN 12 13 13 18   CREATININE 0.70 0.68 0.56 0.64  CALCIUM 9.3 9.2 8.2* 8.2*  MG  --  1.7  --   --     Liver Function Tests: No results found for this basename: AST, ALT, ALKPHOS, BILITOT, PROT, ALBUMIN,  in the last 168 hours  CBC:  Recent Labs Lab 11/30/13 0411 12/01/13 0328  WBC 5.5 4.7  HGB 11.6* 11.4*  HCT 32.5* 31.7*  MCV 87.1 86.1  PLT 258 230    Cardiac Enzymes:  Recent Labs Lab 11/28/13 1920  TROPONINI <0.30    BNP:  Recent Labs  12/01/13 0328  PROBNP 9675.0*    Coagulation: No results found for this basename: INR,  in the last 168 hours  ECG:   Medications:   Scheduled Medications: . aspirin EC  81 mg Oral Daily  . carvedilol  3.125 mg Oral BID WC  . enoxaparin (LOVENOX) injection  30 mg Subcutaneous Q24H    . FLUoxetine  20 mg Oral Daily  . furosemide  20 mg Oral Daily  . insulin aspart  0-5 Units Subcutaneous QHS  . insulin aspart  0-9 Units Subcutaneous TID WC  . lipase/protease/amylase  1 capsule Oral TID AC  . lisinopril  5 mg Oral Daily  . multivitamin with minerals  1 tablet Oral Daily  . nicotine  21 mg Transdermal Q24H  . pregabalin  50 mg Oral TID  . simvastatin  20 mg Oral q1800  . spironolactone  12.5 mg Oral Daily  . tamsulosin  0.4 mg Oral QPC breakfast  . vitamin B-1  250 mg Oral Daily  . Vitamin D (Ergocalciferol)  50,000 Units Oral Q Fri     Infusions:     PRN Medications:       Assessment/Plan    73 yo male with hx of DM, EtOH abuse admitted with SOB, DOE, LE edema, and orthopnea.   1. Acute systolic heart failure  - echo 11/29/13 LVEF 15%, diffuse hypokinesis, grade II diastolic dysfunction. Severe RV dysfunction.  PASP 49  - he is + yesterday, net negative 1.3 liters since admission. Renal function is stable.  He appears euvolemic. Started no oral lasix 20mg  daily - he is on low dose beta blocker and ACE-I that can be further titrated overtime as outpatient. Started on low dose sprironolactone.  - given history of significant dementia will defer on pursuing ischemic evaluation at this time, work to optimize medical management.   2. Hyponatremia - being worked up by primary team, he does not appear to he hypervolemic in relation to his low Na.       Dina Rich, M.D., F.A.C.C.

## 2013-12-02 NOTE — Clinical Social Work Note (Signed)
CSW received follow-up. CSW contacted patient's daughter Raynelle Fanning) and provided bed offers. Per Raynelle Fanning, she wants to wait until [facility] provides answer. Raynelle Fanning stated [facility] is near her home. CSW encouraged Raynelle Fanning to consider other facilities in the event [facility] is not available. Raynelle Fanning stated she would rather wait to find out response from [facility]. CSW verbalized understanding of the above. CSW will follow-up tomorrow.   Keiston Manley Patrick-[place], LCSWA Weekend Clinical Social Worker [phone]

## 2013-12-03 ENCOUNTER — Other Ambulatory Visit: Payer: Self-pay

## 2013-12-03 LAB — BASIC METABOLIC PANEL
BUN: 22 mg/dL (ref 6–23)
CHLORIDE: 87 meq/L — AB (ref 96–112)
CO2: 28 mEq/L (ref 19–32)
Calcium: 8.5 mg/dL (ref 8.4–10.5)
Creatinine, Ser: 0.65 mg/dL (ref 0.50–1.35)
GFR calc non Af Amer: >90 mL/min (ref >90)
Glucose, Bld: 100 mg/dL — ABNORMAL HIGH (ref 70–99)
Potassium: 5.9 mEq/L — ABNORMAL HIGH (ref 3.7–5.3)
Sodium: 126 mEq/L — ABNORMAL LOW (ref 137–147)

## 2013-12-03 LAB — GLUCOSE, CAPILLARY
GLUCOSE-CAPILLARY: 195 mg/dL — AB (ref 70–99)
Glucose-Capillary: 102 mg/dL — ABNORMAL HIGH (ref 70–99)

## 2013-12-03 LAB — SODIUM, URINE, RANDOM: SODIUM UR: 50 meq/L

## 2013-12-03 LAB — CORTISOL: CORTISOL PLASMA: 12.4 ug/dL

## 2013-12-03 LAB — CREATININE, URINE, RANDOM: Creatinine, Urine: 128.51 mg/dL

## 2013-12-03 MED ORDER — SIMVASTATIN 20 MG PO TABS: 20.0000 mg | ORAL_TABLET | Freq: Every day | ORAL | Status: AC

## 2013-12-03 MED ORDER — LISINOPRIL 5 MG PO TABS: 5.0000 mg | ORAL_TABLET | Freq: Every day | ORAL | Status: DC

## 2013-12-03 MED ORDER — GLIPIZIDE 10 MG PO TABS: 5.0000 mg | ORAL_TABLET | Freq: Two times a day (BID) | ORAL | Status: AC

## 2013-12-03 MED ORDER — PREGABALIN 50 MG PO CAPS: 50.0000 mg | ORAL_CAPSULE | Freq: Three times a day (TID) | ORAL | Status: DC

## 2013-12-03 MED ORDER — SPIRONOLACTONE 12.5 MG HALF TABLET: 12.5000 mg | ORAL_TABLET | Freq: Every day | ORAL | Status: DC

## 2013-12-03 MED ORDER — CARVEDILOL 3.125 MG PO TABS: 3.1250 mg | ORAL_TABLET | Freq: Two times a day (BID) | ORAL | Status: AC

## 2013-12-03 MED ORDER — FUROSEMIDE 20 MG PO TABS
20.0000 mg | ORAL_TABLET | Freq: Every day | ORAL | Status: AC
Start: 2013-12-03 — End: ?

## 2013-12-03 NOTE — Discharge Summary (Signed)
Physician Discharge Summary  [name] [name]:[id number] DOB: [birth year] DOA: 11/28/2013  PCP: Bartholome Bill, MD  Admit date: 11/28/2013 Discharge date: 12/03/2013  Time spent: 35 minutes  Recommendations for Outpatient Follow-up:  1. Follow up with pulmonary in 2 week ct chest follow up. 2. Follow up with cardiology in 1 week, b-met at this time. 3.   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 male with Dementia, and DM2 who resides in an Area ALF [facility] to lethargy and "bloating" which has worsened over the past 3 days. He has had SOB and Cough as well. His daughter gives the histtry and reports that over the past 30 days he has rapidly gained weight approximatedly 20 pounds and had noticeable swelling of his legs that was progressing up his body. She has been concerned that the Actos, a recently added medication may be causing the Swelling. In the ED, he was evaluated and found to have a BNP in the 1900s, and a chest X-ray which revealed Bilateral Effusions. He was administered IV Lasix and referred for medical admission   Hospital Course:  Acute systolic CHF (congestive heart failure), NYHA class 2/Pleural effusion  - possible due to ETOH abuse.  - On IV lasix, mod UOP. -JVD no lower extremity edema.  -  Strict I and os' daily weights.  - On lasix, ACE-I, spirinolaconte and coreg. - good response to diuresis. - follow up with cards in 1 week.  Hyponatremia:  - Due to HF.  - Urine osmo and serum low, while hypervolemic hyponatremia.  - now with euvolemic hyponatremia, will fluid restricted him check urine sodium and cr.  - Orthostatic, TSH within normal.  Diabetic neuropathy:  - started Lyrica.  - improved in lower extremity pain.  Incidental lung nodule, > 21m54mnd < 8mm79m follow up with pulmonary as an outpatient.   Hypertension  - stable, see HF.   Type II or unspecified type diabetes mellitus without mention of complication, not stated as uncontrolled  - cont glipizide stable.   Weakness generalized  - PT consult   Severe protein caloric malnutrition:   Procedures:  Ct chest as below  Consultations:  cardiology  Discharge Exam: Filed Vitals:   12/03/13 0542  BP: 102/77  Pulse: 64  Temp: 97.9 F (36.6 C)  Resp: 18    General: A&O x3 Cardiovascular: RRR Respiratory: good air movement CTA B/L  Discharge Instructions You were cared for by a hospitalist during your hospital stay. If you have any questions about your discharge medications or the care you received while you were in the hospital after you are discharged, you can call the unit and asked to speak with the hospitalist on call if the hospitalist that took care of you is not available. Once you are  discharged, your primary care physician will handle any further medical issues. Please note that NO REFILLS for any discharge medications will be authorized once you are discharged, as it is imperative that you return to your primary care physician (or establish a relationship with a primary care physician if you do not have one) for your aftercare needs so that they can reassess your need for medications and monitor your lab values.      Discharge Orders   Future Orders Complete By Expires   Diet - low  sodium heart healthy  As directed    Increase activity slowly  As directed        Medication List         aspirin EC 81 MG tablet  Take 81 mg by mouth daily.     carvedilol 3.125 MG tablet  Commonly known as:  COREG  Take 1 tablet (3.125 mg total) by mouth 2 (two) times daily with a meal.     FLUoxetine 20 MG capsule  Commonly known as:  PROZAC  Take 20 mg by mouth daily.     furosemide 20 MG tablet  Commonly known as:  LASIX  Take 1 tablet (20 mg total) by mouth daily.     glipiZIDE 10 MG tablet  Commonly known as:  GLUCOTROL  Take 0.5 tablets (5 mg total) by mouth 2 (two) times daily before a meal.     lipase/protease/amylase 12000 UNITS Cpep capsule  Commonly known as:  CREON-12/PANCREASE  Take 1 capsule by mouth 3 (three) times daily with meals.     lisinopril 5 MG tablet  Commonly known as:  PRINIVIL,ZESTRIL  Take 1 tablet (5 mg total) by mouth daily.     multivitamin with minerals tablet  Take 1 tablet by mouth daily.     nicotine 21 mg/24hr patch  Commonly known as:  NICODERM CQ - dosed in mg/24 hours  Place 21 mg onto the skin daily.     pregabalin 50 MG capsule  Commonly known as:  LYRICA  Take 1 capsule (50 mg total) by mouth 3 (three) times daily.     simvastatin 20 MG tablet  Commonly known as:  ZOCOR  Take 1 tablet (20 mg total) by mouth daily at 6 PM.     spironolactone 12.5 mg Tabs tablet  Commonly known as:  ALDACTONE  Take 0.5 tablets (12.5 mg total) by mouth daily.     Vitamin D-3 1000 UNITS Caps  Take 1,000 Units by mouth daily.       No Known Allergies Follow-up Information   Follow up with Bartholome Bill, MD.   Specialty:  Family Medicine   Contact information:   [address] [place] [address] [phone]       Follow up with [facility] In 2 weeks. (hospital follow up)    Contact information:   [address] [place] [address]       Follow up with [facility] In 1 week. (hospital follow up)    Contact information:   [place] [address] [phone]       The results of significant diagnostics from this hospitalization (including imaging, microbiology, ancillary and laboratory) are listed below for reference.    Significant Diagnostic Studies: Dg Chest 2 View  11/28/2013   CLINICAL DATA:  Fatigue and muscle weakness.  EXAM: CHEST  2 VIEW  COMPARISON:  None.  FINDINGS: Lung volumes are low. There is bibasilar collapse/ consolidation with small bilateral pleural effusions. No evidence for overt pulmonary edema at this time. The cardio pericardial silhouette is enlarged. Bones are diffusely demineralized. Telemetry leads overlie the chest.  IMPRESSION: Bilateral lower lobe collapse/consolidation with small to moderate bilateral pleural effusions.   Electronically Signed   By: Misty Stanley M.D.   On: 11/28/2013 20:24   Ct Chest W Contrast  11/29/2013   CLINICAL DATA:  73 year old male with shortness of breath and cough. Recent weight gain and lower extremity swelling. Pleural effusions. Initial encounter.  EXAM: CT CHEST WITH CONTRAST  TECHNIQUE: Multidetector CT imaging of the chest was performed during intravenous contrast administration.  CONTRAST:  23m OMNIPAQUE IOHEXOL 300 MG/ML  SOLN  COMPARISON:  Chest radiographs 11/28/2013.  FINDINGS: Moderate to large bilateral layering pleural effusions, the right is larger. Simple fluid densitometry suggesting transudate fluid. No pericardial effusion. Cardiomegaly. Widespread atherosclerotic plaque in the aorta. Coronary artery involvement.  Compressive atelectasis in both lungs. There is also a 7 mm right middle lobe lung nodule on series 3, image 38. Major airways are patent.  No acute or suspicious osseous lesion. Mild chronic left lateral tenth rib fracture. Mild scoliosis.  No axillary or mediastinal lymphadenopathy.   Visualized upper abdomen remarkable for confluent dystrophic calcifications throughout the visible pancreas.  IMPRESSION: 1. Moderate to large right greater than left layering pleural effusions with compressive atelectasis. 2. Seven mm right middle lobe lung nodule (series 3, image 38). If the patient is at high risk for bronchogenic carcinoma, follow-up chest CT at 3-6 months is recommended. If the patient is at low risk for bronchogenic carcinoma, follow-up chest CT at 6-12 months is recommended. This recommendation follows the consensus statement: Guidelines for Management of Small Pulmonary Nodules Detected on CT Scans: A Statement from the [place] published in Radiology 2005; 237:395-400. 3. Chronic calcific pancreatitis.   Electronically Signed   By: LLars PinksM.D.   On: 11/29/2013 13:54    Microbiology: Recent Results (from the past 240 hour(s))  CLOSTRIDIUM DIFFICILE BY PCR     Status: None   Collection Time    11/29/13  4:21 PM      Result Value Ref Range Status   C difficile by pcr NEGATIVE  NEGATIVE Final     Labs: Basic Metabolic Panel:  Recent Labs Lab 11/28/13 2235 11/30/13 0411 12/01/13 0328 12/02/13 0508 12/02/13 1400  NA 131* 130* 127* 124* 125*  K 4.2 4.8 4.2 4.8 4.4  CL 91* 90* 87* 87* 85*  CO2 _0 GLUCOSE 81 72 128* 105* 126*  BUN _1 CREATININE 0.70 0.68 0.56 0.64 0.69  CALCIUM 9.3 9.2 8.2* 8.2* 8.7  MG  --  1.7  --   --   --   PHOS  --  3.5  --   --   --    Liver Function Tests: No results found for this basename: AST, ALT, ALKPHOS, BILITOT, PROT, ALBUMIN,  in the last 168 hours No results found for this basename: LIPASE, AMYLASE,  in the last 168 hours No results found for this basename: AMMONIA,  in the last 168 hours CBC:  Recent Labs Lab 11/30/13 0411 12/01/13 0328  WBC 5.5 4.7  HGB 11.6* 11.4*  HCT 32.5* 31.7*  MCV 87.1 86.1  PLT 258 230   Cardiac Enzymes:  Recent Labs Lab 11/28/13 1920  TROPONINI  <0.30  BNP: BNP (last 3 results)  Recent Labs  12/01/13 0328  PROBNP 9675.0*   CBG:  Recent Labs Lab 12/02/13 0659 12/02/13 1154 12/02/13 1622 12/02/13 2145 12/03/13 0614  GLUCAP 87 193* 151* 70 102*       Signed:  FELIZ ORTIZ, Elizabth Palka  [facility] 12/03/2013, 8:40 AM

## 2013-12-03 NOTE — Progress Notes (Signed)
Patient resting quietly. HS snack given for CBG of 70.

## 2013-12-03 NOTE — Progress Notes (Signed)
PT Cancellation Note  Patient Details Name: [name] [name] MRN: [id number] DOB: Mar 01, 1941   Cancelled Treatment:    Reason Eval/Treat Not Completed: Other (comment). EMS transport arrived after therapist began session to transfer to SNF. Will defer treatment session to next venue of care.    Ruthann Cancer 12/03/2013, 2:50 PM  Ruthann Cancer, PT, DPT Acute Rehabilitation Services Pager: [phone]

## 2013-12-03 NOTE — Progress Notes (Signed)
Subjective: No orthopnea/SOB  Objective: Vital signs in last 24 hours: Temp:  [97.4 F (36.3 C)-97.9 F (36.6 C)] 97.9 F (36.6 C) (04/06 0542) Pulse Rate:  [64-81] 64 (04/06 0542) Resp:  [17-18] 18 (04/06 0542) BP: (90-110)/(57-77) 102/77 mmHg (04/06 0542) SpO2:  [94 %-99 %] 94 % (04/06 0542) Weight:  [126 lb 14.4 oz (57.561 kg)] 126 lb 14.4 oz (57.561 kg) (04/06 0542) Last BM Date: 12/01/13  Intake/Output from previous day: 04/05 0701 - 04/06 0700 In: 360 [P.O.:360] Out: -  Intake/Output this shift: Total I/O In: 360 [P.O.:360] Out: -   Medications Current Facility-Administered Medications  Medication Dose Route Frequency Provider Last Rate Last Dose  . aspirin EC tablet 81 mg  81 mg Oral Daily Ron Parker, MD   81 mg at 12/02/13 1121  . carvedilol (COREG) tablet 3.125 mg  3.125 mg Oral BID WC Ron Parker, MD   3.125 mg at 12/03/13 7482  . enoxaparin (LOVENOX) injection 30 mg  30 mg Subcutaneous Q24H Ron Parker, MD   30 mg at 12/02/13 1320  . FLUoxetine (PROZAC) capsule 20 mg  20 mg Oral Daily Ron Parker, MD   20 mg at 12/02/13 1121  . furosemide (LASIX) tablet 20 mg  20 mg Oral Daily Marinda Elk, MD   20 mg at 12/02/13 1121  . insulin aspart (novoLOG) injection 0-5 Units  0-5 Units Subcutaneous QHS Harvette C Jenkins, MD      . insulin aspart (novoLOG) injection 0-9 Units  0-9 Units Subcutaneous TID WC Ron Parker, MD   2 Units at 12/02/13 1731  . lipase/protease/amylase (CREON-12/PANCREASE) capsule 1 capsule  1 capsule Oral TID AC Ron Parker, MD   1 capsule at 12/03/13 [phone]  . lisinopril (PRINIVIL,ZESTRIL) tablet 5 mg  5 mg Oral Daily Lewayne Bunting, MD   5 mg at 12/01/13 1119  . multivitamin with minerals tablet 1 tablet  1 tablet Oral Daily Ron Parker, MD   1 tablet at 12/02/13 1121  . nicotine (NICODERM CQ - dosed in mg/24 hours) patch 21 mg  21 mg Transdermal Q24H Ron Parker, MD   21 mg at  12/02/13 1124  . pregabalin (LYRICA) capsule 50 mg  50 mg Oral TID Marinda Elk, MD   50 mg at 12/02/13 2158  . simvastatin (ZOCOR) tablet 20 mg  20 mg Oral q1800 Simbiso Ranga, MD   20 mg at 12/02/13 2158  . spironolactone (ALDACTONE) tablet 12.5 mg  12.5 mg Oral Daily Marinda Elk, MD   12.5 mg at 12/02/13 1121  . tamsulosin (FLOMAX) capsule 0.4 mg  0.4 mg Oral QPC breakfast Marinda Elk, MD   0.4 mg at 12/02/13 1120  . thiamine (VITAMIN B-1) tablet 250 mg  250 mg Oral Daily Ron Parker, MD   250 mg at 12/02/13 1121  . Vitamin D (Ergocalciferol) (DRISDOL) capsule 50,000 Units  50,000 Units Oral Q Fri Ron Parker, MD   50,000 Units at 11/30/13 1002    PE: General appearance: alert, cooperative and no distress Lungs: clear to auscultation bilaterally Heart: regular rate and rhythm Extremities: No LEE Pulses: 1+ Radials.  0DPs.  Feet warm. Skin: Warm and dry Neurologic: Grossly normal  Lab Results:   Recent Labs  12/01/13 0328  WBC 4.7  HGB 11.4*  HCT 31.7*  PLT 230   BMET  Recent Labs  12/01/13 0328 12/02/13 0508 12/02/13 1400  NA 127*  124* 125*  K 4.2 4.8 4.4  CL 87* 87* 85*  CO2 24 26 27   GLUCOSE 128* 105* 126*  BUN 13 18 20   CREATININE 0.56 0.64 0.69  CALCIUM 8.2* 8.2* 8.7    Assessment/Plan   Principal Problem:   73 yo male with hx of DM, EtOH abuse admitted with SOB, DOE, LE edema, and orthopnea.  EF 15% with grade two diastolic dysf. Mod TR.  Peak PA pressure 49mmHg.   1. Acute systolic heart failure  Net fluids:  +0.4L/-0.9L.  Lasix 20mg  Daily.  Appears euvolemic.  Laying flat and breathing comfortably when I entered. Mildly hypotensive.  Lisinopril 5. Coreg 3.125.  Consider backing off on the Ace to 2.5.  2. Hyponatremia   Per primary.  No labs yet this morning.     LOS: 5 days    HAGER, BRYAN PA-C 12/03/2013 6:46 AM  Patient seen, examined. Available data reviewed. Agree with findings, assessment, and plan as  outlined by Wilburt FinlayBryan Hager, PA-C. Exam reveals an alert male in no distress. He is resting comfortably. Lungs are clear. Heart is regular rate and rhythm. Jugular venous pressure is normal. There is no peripheral edema.  I have reviewed extensive notes and I discussed the patient's overall care plan with his daughter. He will go to skilled nursing today. He has dementia and has progressively declined with long-standing history of heavy alcohol abuse. He has severe LV dysfunction and associated systolic heart failure. They do not wish for any aggressive interventions and this is clearly appropriate. Will focus on his comfort. Considering his low blood pressure, I agree that lisinopril should be decreased to 2.5 mg. Will continue his other cardiac medications which includes spironolactone, low-dose carvedilol, and low-dose furosemide.  Tonny BollmanMichael Latham Kinzler, M.D. 12/03/2013 11:10 AM

## 2013-12-03 NOTE — Telephone Encounter (Signed)
RX sent to [facility] @ [phone]. Phone number [phone]  

## 2013-12-03 NOTE — Progress Notes (Signed)
D/C Tele, D/C IV, D/C instructions reviewed with pt. And pt.'s daughter, D/C instructions sent with EMS transporting pt. To [facility], daughter also given copy of D/C instructions, pt. Showed no signs or symptoms of distress, prescription given to EMS, pt. Transported from unit and to facility via EMS.

## 2013-12-04 ENCOUNTER — Emergency Department (HOSPITAL_COMMUNITY): Payer: Medicare Other

## 2013-12-04 ENCOUNTER — Inpatient Hospital Stay (HOSPITAL_COMMUNITY): Payer: Medicare Other | Admitting: Anesthesiology

## 2013-12-04 ENCOUNTER — Inpatient Hospital Stay (HOSPITAL_COMMUNITY): Payer: Medicare Other

## 2013-12-04 ENCOUNTER — Encounter (HOSPITAL_COMMUNITY): Payer: Self-pay | Admitting: Emergency Medicine

## 2013-12-04 ENCOUNTER — Encounter (HOSPITAL_COMMUNITY)
Admission: EM | Disposition: A | Discharge status: Skilled Nursing Facility | Payer: Self-pay | Source: Home / Self Care | Attending: Pulmonary Disease

## 2013-12-04 ENCOUNTER — Encounter (HOSPITAL_COMMUNITY): Payer: Medicare Other | Admitting: Anesthesiology

## 2013-12-04 ENCOUNTER — Inpatient Hospital Stay (HOSPITAL_COMMUNITY)
Admission: EM | Admit: 2013-12-04 | Discharge: 2013-12-10 | DRG: 469 | Disposition: A | Discharge status: Skilled Nursing Facility | Payer: Medicare Other | Attending: Internal Medicine | Admitting: Internal Medicine

## 2013-12-04 DIAGNOSIS — Z66 Do not resuscitate: Secondary | ICD-10-CM

## 2013-12-04 DIAGNOSIS — I5021 Acute systolic (congestive) heart failure: Secondary | ICD-10-CM

## 2013-12-04 DIAGNOSIS — E43 Unspecified severe protein-calorie malnutrition: Secondary | ICD-10-CM | POA: Diagnosis present

## 2013-12-04 DIAGNOSIS — E1165 Type 2 diabetes mellitus with hyperglycemia: Secondary | ICD-10-CM

## 2013-12-04 DIAGNOSIS — S72002A Fracture of unspecified part of neck of left femur, initial encounter for closed fracture: Secondary | ICD-10-CM | POA: Diagnosis present

## 2013-12-04 DIAGNOSIS — J90 Pleural effusion, not elsewhere classified: Secondary | ICD-10-CM

## 2013-12-04 DIAGNOSIS — Z8249 Family history of ischemic heart disease and other diseases of the circulatory system: Secondary | ICD-10-CM

## 2013-12-04 DIAGNOSIS — E871 Hypo-osmolality and hyponatremia: Secondary | ICD-10-CM | POA: Diagnosis present

## 2013-12-04 DIAGNOSIS — Y9210 Unspecified residential institution as the place of occurrence of the external cause: Secondary | ICD-10-CM | POA: Diagnosis present

## 2013-12-04 DIAGNOSIS — E119 Type 2 diabetes mellitus without complications: Secondary | ICD-10-CM | POA: Diagnosis present

## 2013-12-04 DIAGNOSIS — W19XXXA Unspecified fall, initial encounter: Secondary | ICD-10-CM | POA: Diagnosis present

## 2013-12-04 DIAGNOSIS — R911 Solitary pulmonary nodule: Secondary | ICD-10-CM | POA: Diagnosis present

## 2013-12-04 DIAGNOSIS — S72009A Fracture of unspecified part of neck of unspecified femur, initial encounter for closed fracture: Principal | ICD-10-CM | POA: Diagnosis present

## 2013-12-04 DIAGNOSIS — I9589 Other hypotension: Secondary | ICD-10-CM | POA: Diagnosis not present

## 2013-12-04 DIAGNOSIS — I5042 Chronic combined systolic (congestive) and diastolic (congestive) heart failure: Secondary | ICD-10-CM | POA: Diagnosis present

## 2013-12-04 DIAGNOSIS — F17200 Nicotine dependence, unspecified, uncomplicated: Secondary | ICD-10-CM | POA: Diagnosis present

## 2013-12-04 DIAGNOSIS — E1140 Type 2 diabetes mellitus with diabetic neuropathy, unspecified: Secondary | ICD-10-CM | POA: Diagnosis present

## 2013-12-04 DIAGNOSIS — I509 Heart failure, unspecified: Secondary | ICD-10-CM | POA: Diagnosis present

## 2013-12-04 DIAGNOSIS — Z803 Family history of malignant neoplasm of breast: Secondary | ICD-10-CM

## 2013-12-04 DIAGNOSIS — D62 Acute posthemorrhagic anemia: Secondary | ICD-10-CM | POA: Diagnosis not present

## 2013-12-04 DIAGNOSIS — Z515 Encounter for palliative care: Secondary | ICD-10-CM

## 2013-12-04 DIAGNOSIS — J95821 Acute postprocedural respiratory failure: Secondary | ICD-10-CM | POA: Diagnosis not present

## 2013-12-04 DIAGNOSIS — I10 Essential (primary) hypertension: Secondary | ICD-10-CM | POA: Diagnosis present

## 2013-12-04 DIAGNOSIS — I959 Hypotension, unspecified: Secondary | ICD-10-CM

## 2013-12-04 DIAGNOSIS — F0390 Unspecified dementia without behavioral disturbance: Secondary | ICD-10-CM | POA: Diagnosis present

## 2013-12-04 DIAGNOSIS — IMO0002 Reserved for concepts with insufficient information to code with codable children: Secondary | ICD-10-CM | POA: Diagnosis present

## 2013-12-04 DIAGNOSIS — J9600 Acute respiratory failure, unspecified whether with hypoxia or hypercapnia: Secondary | ICD-10-CM | POA: Diagnosis not present

## 2013-12-04 DIAGNOSIS — Z01810 Encounter for preprocedural cardiovascular examination: Secondary | ICD-10-CM

## 2013-12-04 DIAGNOSIS — G2401 Drug induced subacute dyskinesia: Secondary | ICD-10-CM | POA: Diagnosis present

## 2013-12-04 DIAGNOSIS — E875 Hyperkalemia: Secondary | ICD-10-CM | POA: Diagnosis present

## 2013-12-04 DIAGNOSIS — Z720 Tobacco use: Secondary | ICD-10-CM | POA: Diagnosis present

## 2013-12-04 HISTORY — DX: Essential (primary) hypertension: I10

## 2013-12-04 HISTORY — DX: Fracture of unspecified part of neck of unspecified femur, initial encounter for closed fracture: S72.009A

## 2013-12-04 HISTORY — DX: Type 2 diabetes mellitus without complications: E11.9

## 2013-12-04 HISTORY — DX: Chronic combined systolic (congestive) and diastolic (congestive) heart failure: I50.42

## 2013-12-04 HISTORY — PX: HIP ARTHROPLASTY: SHX981

## 2013-12-04 HISTORY — DX: Dilated cardiomyopathy: I42.0

## 2013-12-04 HISTORY — DX: Solitary pulmonary nodule: R91.1

## 2013-12-04 HISTORY — DX: Unspecified severe protein-calorie malnutrition: E43

## 2013-12-04 HISTORY — DX: Pleural effusion, not elsewhere classified: J90

## 2013-12-04 HISTORY — DX: Pure hypercholesterolemia, unspecified: E78.00

## 2013-12-04 LAB — BASIC METABOLIC PANEL
BUN: 24 mg/dL — ABNORMAL HIGH (ref 6–23)
BUN: 27 mg/dL — AB (ref 6–23)
CHLORIDE: 89 meq/L — AB (ref 96–112)
CO2: 25 mEq/L (ref 19–32)
CO2: 23 meq/L (ref 19–32)
Calcium: 7.9 mg/dL — ABNORMAL LOW (ref 8.4–10.5)
Calcium: 8.5 mg/dL (ref 8.4–10.5)
Chloride: 89 mEq/L — ABNORMAL LOW (ref 96–112)
Creatinine, Ser: 0.82 mg/dL (ref 0.50–1.35)
Creatinine, Ser: 0.8 mg/dL (ref 0.50–1.35)
GFR calc Af Amer: >90 mL/min (ref >90)
GFR calc Af Amer: >90 mL/min (ref >90)
GFR calc non Af Amer: 88 mL/min — ABNORMAL LOW (ref >90)
GFR calc non Af Amer: 88 mL/min — ABNORMAL LOW (ref >90)
Glucose, Bld: 145 mg/dL — ABNORMAL HIGH (ref 70–99)
Glucose, Bld: 122 mg/dL — ABNORMAL HIGH (ref 70–99)
Potassium: 5.9 mEq/L — ABNORMAL HIGH (ref 3.7–5.3)
Potassium: 5.7 mEq/L — ABNORMAL HIGH (ref 3.7–5.3)
Sodium: 124 mEq/L — ABNORMAL LOW (ref 137–147)
Sodium: 125 mEq/L — ABNORMAL LOW (ref 137–147)

## 2013-12-04 LAB — URINALYSIS, ROUTINE W REFLEX MICROSCOPIC
Bilirubin Urine: NEGATIVE
Glucose, UA: NEGATIVE mg/dL
Hgb urine dipstick: NEGATIVE
Ketones, ur: 15 mg/dL — AB
Nitrite: NEGATIVE
Protein, ur: NEGATIVE mg/dL
Specific Gravity, Urine: 1.024 (ref 1.005–1.030)
Urobilinogen, UA: 0.2 mg/dL (ref 0.0–1.0)
pH: 5.5 (ref 5.0–8.0)

## 2013-12-04 LAB — POCT I-STAT 7, (LYTES, BLD GAS, ICA,H+H)
ACID-BASE DEFICIT: 3 mmol/L — AB (ref 0.0–2.0)
Bicarbonate: 20.8 mEq/L (ref 20.0–24.0)
CALCIUM ION: 1.02 mmol/L — AB (ref 1.13–1.30)
HEMATOCRIT: 27 % — AB (ref 39.0–52.0)
Hemoglobin: 9.2 g/dL — ABNORMAL LOW (ref 13.0–17.0)
O2 Saturation: 93 %
PCO2 ART: 31.5 mmHg — AB (ref 35.0–45.0)
POTASSIUM: 4.8 meq/L (ref 3.7–5.3)
Patient temperature: 36
Sodium: 124 mEq/L — ABNORMAL LOW (ref 137–147)
TCO2: 22 mmol/L (ref 0–100)
pH, Arterial: 7.423 (ref 7.350–7.450)
pO2, Arterial: 62 mmHg — ABNORMAL LOW (ref 80.0–100.0)

## 2013-12-04 LAB — URINE MICROSCOPIC-ADD ON

## 2013-12-04 LAB — CBC WITH DIFFERENTIAL/PLATELET
Basophils Absolute: 0 10*3/uL (ref 0.0–0.1)
Basophils Relative: 0 % (ref 0–1)
Eosinophils Absolute: 0.1 10*3/uL (ref 0.0–0.7)
Eosinophils Relative: 2 % (ref 0–5)
HCT: 28.6 % — ABNORMAL LOW (ref 39.0–52.0)
Hemoglobin: 10.2 g/dL — ABNORMAL LOW (ref 13.0–17.0)
Lymphocytes Relative: 19 % (ref 12–46)
Lymphs Abs: 1 10*3/uL (ref 0.7–4.0)
MCH: 31.1 pg (ref 26.0–34.0)
MCHC: 35.7 g/dL (ref 30.0–36.0)
MCV: 87.2 fL (ref 78.0–100.0)
Monocytes Absolute: 0.4 10*3/uL (ref 0.1–1.0)
Monocytes Relative: 8 % (ref 3–12)
Neutro Abs: 3.8 10*3/uL (ref 1.7–7.7)
Neutrophils Relative %: 71 % (ref 43–77)
Platelets: 200 10*3/uL (ref 150–400)
RBC: 3.28 MIL/uL — ABNORMAL LOW (ref 4.22–5.81)
RDW: 12.7 % (ref 11.5–15.5)
WBC: 5.3 10*3/uL (ref 4.0–10.5)

## 2013-12-04 LAB — TROPONIN I: Troponin I: <0.3 ng/mL (ref <0.30)

## 2013-12-04 LAB — MRSA PCR SCREENING: MRSA BY PCR: NEGATIVE

## 2013-12-04 LAB — CBG MONITORING, ED: Glucose-Capillary: 162 mg/dL — ABNORMAL HIGH (ref 70–99)

## 2013-12-04 LAB — PRO B NATRIURETIC PEPTIDE: Pro B Natriuretic peptide (BNP): 5148 pg/mL — ABNORMAL HIGH (ref 0–125)

## 2013-12-04 LAB — TYPE AND SCREEN
ABO/RH(D): O POS
ANTIBODY SCREEN: NEGATIVE

## 2013-12-04 LAB — ABO/RH: ABO/RH(D): O POS

## 2013-12-04 SURGERY — HEMIARTHROPLASTY, HIP, DIRECT ANTERIOR APPROACH, FOR FRACTURE
Anesthesia: General | Site: Hip | Laterality: Left

## 2013-12-04 MED ORDER — PHENOL 1.4 % MT LIQD
1.0000 | OROMUCOSAL | Status: DC | PRN
  Filled 2013-12-04: qty 177

## 2013-12-04 MED ORDER — MIDAZOLAM HCL 2 MG/2ML IJ SOLN
INTRAMUSCULAR | Status: AC
  Filled 2013-12-04: qty 2

## 2013-12-04 MED ORDER — CEFAZOLIN SODIUM-DEXTROSE 2-3 GM-% IV SOLR
2.0000 g | Freq: Four times a day (QID) | INTRAVENOUS | Status: AC
  Administered 2013-12-04 – 2013-12-05 (×2): 2 g via INTRAVENOUS
  Filled 2013-12-04 (×2): qty 50

## 2013-12-04 MED ORDER — FENTANYL CITRATE 0.05 MG/ML IJ SOLN
INTRAMUSCULAR | Status: DC | PRN
  Administered 2013-12-04: 100 ug via INTRAVENOUS

## 2013-12-04 MED ORDER — HYDROCODONE-ACETAMINOPHEN 5-325 MG PO TABS: 1.0000 | ORAL_TABLET | Freq: Four times a day (QID) | ORAL | Status: DC | PRN

## 2013-12-04 MED ORDER — CHLORHEXIDINE GLUCONATE 0.12 % MT SOLN
15.0000 mL | Freq: Two times a day (BID) | OROMUCOSAL | Status: DC
  Administered 2013-12-04 – 2013-12-10 (×11): 15 mL via OROMUCOSAL
  Filled 2013-12-04 (×12): qty 15

## 2013-12-04 MED ORDER — CEFAZOLIN SODIUM-DEXTROSE 2-3 GM-% IV SOLR
INTRAVENOUS | Status: DC | PRN
  Administered 2013-12-04: 2 g via INTRAVENOUS

## 2013-12-04 MED ORDER — ONDANSETRON HCL 4 MG/2ML IJ SOLN: 4.0000 mg | Freq: Four times a day (QID) | INTRAMUSCULAR | Status: DC | PRN

## 2013-12-04 MED ORDER — SUCCINYLCHOLINE CHLORIDE 20 MG/ML IJ SOLN
INTRAMUSCULAR | Status: DC | PRN
  Administered 2013-12-04: 100 mg via INTRAVENOUS

## 2013-12-04 MED ORDER — FERROUS SULFATE 325 (65 FE) MG PO TABS
325.0000 mg | ORAL_TABLET | Freq: Three times a day (TID) | ORAL | Status: DC
  Administered 2013-12-05 – 2013-12-10 (×15): 325 mg via ORAL
  Filled 2013-12-04 (×19): qty 1

## 2013-12-04 MED ORDER — LIDOCAINE HCL (CARDIAC) 20 MG/ML IV SOLN
INTRAVENOUS | Status: DC | PRN
  Administered 2013-12-04: 50 mg via INTRAVENOUS

## 2013-12-04 MED ORDER — MIDAZOLAM HCL 5 MG/5ML IJ SOLN
INTRAMUSCULAR | Status: DC | PRN
  Administered 2013-12-04: 2 mg via INTRAVENOUS

## 2013-12-04 MED ORDER — FENTANYL CITRATE 0.05 MG/ML IJ SOLN
25.0000 ug | INTRAMUSCULAR | Status: DC | PRN
  Administered 2013-12-06: 50 ug via INTRAVENOUS
  Filled 2013-12-04: qty 2

## 2013-12-04 MED ORDER — 0.9 % SODIUM CHLORIDE (POUR BTL) OPTIME
TOPICAL | Status: DC | PRN
  Administered 2013-12-04: 1000 mL

## 2013-12-04 MED ORDER — ONDANSETRON HCL 4 MG PO TABS: 4.0000 mg | ORAL_TABLET | Freq: Four times a day (QID) | ORAL | Status: DC | PRN

## 2013-12-04 MED ORDER — ALBUTEROL SULFATE HFA 108 (90 BASE) MCG/ACT IN AERS
INHALATION_SPRAY | RESPIRATORY_TRACT | Status: DC | PRN
  Administered 2013-12-04: 2 via RESPIRATORY_TRACT

## 2013-12-04 MED ORDER — HYDROMORPHONE HCL PF 1 MG/ML IJ SOLN
0.5000 mg | INTRAMUSCULAR | Status: DC | PRN
  Administered 2013-12-04: 0.5 mg via INTRAVENOUS

## 2013-12-04 MED ORDER — BIOTENE DRY MOUTH MT LIQD
15.0000 mL | Freq: Four times a day (QID) | OROMUCOSAL | Status: DC
  Administered 2013-12-05 – 2013-12-09 (×14): 15 mL via OROMUCOSAL

## 2013-12-04 MED ORDER — PREGABALIN 50 MG PO CAPS
50.0000 mg | ORAL_CAPSULE | Freq: Three times a day (TID) | ORAL | Status: DC
  Administered 2013-12-05 – 2013-12-06 (×4): 50 mg via ORAL
  Filled 2013-12-04 (×3): qty 1
  Filled 2013-12-04 (×2): qty 2
  Filled 2013-12-04: qty 1

## 2013-12-04 MED ORDER — ALBUTEROL SULFATE HFA 108 (90 BASE) MCG/ACT IN AERS
INHALATION_SPRAY | RESPIRATORY_TRACT | Status: AC
  Filled 2013-12-04: qty 6.7

## 2013-12-04 MED ORDER — HYDROMORPHONE HCL PF 1 MG/ML IJ SOLN
0.5000 mg | INTRAMUSCULAR | Status: DC | PRN
  Filled 2013-12-04: qty 1

## 2013-12-04 MED ORDER — ROCURONIUM BROMIDE 100 MG/10ML IV SOLN
INTRAVENOUS | Status: DC | PRN
  Administered 2013-12-04: 50 mg via INTRAVENOUS

## 2013-12-04 MED ORDER — SODIUM BICARBONATE 8.4 % IV SOLN
50.0000 meq | Freq: Once | INTRAVENOUS | Status: AC
  Administered 2013-12-04: 50 meq via INTRAVENOUS
  Filled 2013-12-04: qty 50

## 2013-12-04 MED ORDER — ROCURONIUM BROMIDE 50 MG/5ML IV SOLN
INTRAVENOUS | Status: AC
  Filled 2013-12-04: qty 1

## 2013-12-04 MED ORDER — POLYETHYLENE GLYCOL 3350 17 G PO PACK: 17.0000 g | PACK | Freq: Every day | ORAL | Status: DC | PRN

## 2013-12-04 MED ORDER — METOCLOPRAMIDE HCL 5 MG PO TABS
5.0000 mg | ORAL_TABLET | Freq: Three times a day (TID) | ORAL | Status: DC | PRN
  Administered 2013-12-05 – 2013-12-06 (×2): 5 mg via ORAL
  Filled 2013-12-04: qty 2

## 2013-12-04 MED ORDER — LACTATED RINGERS IV SOLN
INTRAVENOUS | Status: DC | PRN
  Administered 2013-12-04: 21:00:00 via INTRAVENOUS

## 2013-12-04 MED ORDER — NEOSTIGMINE METHYLSULFATE 1 MG/ML IJ SOLN
INTRAMUSCULAR | Status: AC
  Filled 2013-12-04: qty 10

## 2013-12-04 MED ORDER — BISACODYL 10 MG RE SUPP: 10.0000 mg | Freq: Every day | RECTAL | Status: DC | PRN

## 2013-12-04 MED ORDER — SODIUM CHLORIDE 0.9 % IJ SOLN
INTRAMUSCULAR | Status: AC
  Filled 2013-12-04: qty 10

## 2013-12-04 MED ORDER — SODIUM CHLORIDE 0.9 % IV BOLUS (SEPSIS)
500.0000 mL | Freq: Once | INTRAVENOUS | Status: AC
  Administered 2013-12-04: 500 mL via INTRAVENOUS

## 2013-12-04 MED ORDER — ETOMIDATE 2 MG/ML IV SOLN
INTRAVENOUS | Status: DC | PRN
  Administered 2013-12-04: 14 mg via INTRAVENOUS

## 2013-12-04 MED ORDER — HYDROMORPHONE HCL PF 2 MG/ML IJ SOLN: 0.5000 mg | INTRAMUSCULAR | Status: DC | PRN

## 2013-12-04 MED ORDER — MENTHOL 3 MG MT LOZG
1.0000 | LOZENGE | OROMUCOSAL | Status: DC | PRN
  Filled 2013-12-04: qty 9

## 2013-12-04 MED ORDER — FENTANYL CITRATE 0.05 MG/ML IJ SOLN
50.0000 ug | Freq: Once | INTRAMUSCULAR | Status: AC
  Administered 2013-12-04: 50 ug via INTRAVENOUS
  Filled 2013-12-04: qty 2

## 2013-12-04 MED ORDER — ENOXAPARIN SODIUM 30 MG/0.3ML ~~LOC~~ SOLN: 30.0000 mg | SUBCUTANEOUS | Status: DC

## 2013-12-04 MED ORDER — ALBUMIN HUMAN 5 % IV SOLN
INTRAVENOUS | Status: DC | PRN
  Administered 2013-12-04: 22:00:00 via INTRAVENOUS

## 2013-12-04 MED ORDER — GLYCOPYRROLATE 0.2 MG/ML IJ SOLN
INTRAMUSCULAR | Status: AC
  Filled 2013-12-04: qty 2

## 2013-12-04 MED ORDER — CARVEDILOL 3.125 MG PO TABS
3.1250 mg | ORAL_TABLET | Freq: Two times a day (BID) | ORAL | Status: DC
  Filled 2013-12-04 (×3): qty 1

## 2013-12-04 MED ORDER — SODIUM CHLORIDE 0.9 % IV SOLN
1.0000 g | Freq: Once | INTRAVENOUS | Status: AC
  Administered 2013-12-04: 1 g via INTRAVENOUS
  Filled 2013-12-04: qty 10

## 2013-12-04 MED ORDER — METHOCARBAMOL 100 MG/ML IJ SOLN: 500.0000 mg | Freq: Four times a day (QID) | INTRAVENOUS | Status: DC | PRN

## 2013-12-04 MED ORDER — EPHEDRINE SULFATE 50 MG/ML IJ SOLN
INTRAMUSCULAR | Status: DC | PRN
  Administered 2013-12-04 (×2): 10 mg via INTRAVENOUS

## 2013-12-04 MED ORDER — PANCRELIPASE (LIP-PROT-AMYL) 12000-38000 UNITS PO CPEP
1.0000 | ORAL_CAPSULE | Freq: Three times a day (TID) | ORAL | Status: DC
  Administered 2013-12-05 – 2013-12-10 (×15): 1 via ORAL
  Filled 2013-12-04 (×19): qty 1

## 2013-12-04 MED ORDER — SUCCINYLCHOLINE CHLORIDE 20 MG/ML IJ SOLN
INTRAMUSCULAR | Status: AC
  Filled 2013-12-04: qty 1

## 2013-12-04 MED ORDER — ONDANSETRON HCL 4 MG/2ML IJ SOLN
INTRAMUSCULAR | Status: AC
  Filled 2013-12-04: qty 2

## 2013-12-04 MED ORDER — ONDANSETRON HCL 4 MG/2ML IJ SOLN
INTRAMUSCULAR | Status: DC | PRN
  Administered 2013-12-04: 4 mg via INTRAVENOUS

## 2013-12-04 MED ORDER — ENOXAPARIN SODIUM 30 MG/0.3ML ~~LOC~~ SOLN
30.0000 mg | SUBCUTANEOUS | Status: DC
  Administered 2013-12-05: 30 mg via SUBCUTANEOUS
  Filled 2013-12-04 (×3): qty 0.3

## 2013-12-04 MED ORDER — EPHEDRINE SULFATE 50 MG/ML IJ SOLN
INTRAMUSCULAR | Status: AC
  Filled 2013-12-04: qty 1

## 2013-12-04 MED ORDER — DOCUSATE SODIUM 100 MG PO CAPS
100.0000 mg | ORAL_CAPSULE | Freq: Two times a day (BID) | ORAL | Status: DC
  Administered 2013-12-05 – 2013-12-10 (×8): 100 mg via ORAL
  Filled 2013-12-04 (×14): qty 1

## 2013-12-04 MED ORDER — METOCLOPRAMIDE HCL 5 MG/ML IJ SOLN
5.0000 mg | Freq: Three times a day (TID) | INTRAMUSCULAR | Status: DC | PRN
  Filled 2013-12-04: qty 2

## 2013-12-04 MED ORDER — ADULT MULTIVITAMIN W/MINERALS CH
1.0000 | ORAL_TABLET | Freq: Every day | ORAL | Status: DC
  Administered 2013-12-05 – 2013-12-10 (×6): 1 via ORAL
  Filled 2013-12-04 (×7): qty 1

## 2013-12-04 MED ORDER — FENTANYL CITRATE 0.05 MG/ML IJ SOLN
INTRAMUSCULAR | Status: AC
  Filled 2013-12-04: qty 5

## 2013-12-04 MED ORDER — CEFAZOLIN SODIUM-DEXTROSE 2-3 GM-% IV SOLR
INTRAVENOUS | Status: AC
  Filled 2013-12-04: qty 50

## 2013-12-04 MED ORDER — SIMVASTATIN 20 MG PO TABS
20.0000 mg | ORAL_TABLET | Freq: Every day | ORAL | Status: DC
  Administered 2013-12-05 – 2013-12-10 (×5): 20 mg via ORAL
  Filled 2013-12-04 (×6): qty 1

## 2013-12-04 MED ORDER — METHOCARBAMOL 500 MG PO TABS
500.0000 mg | ORAL_TABLET | Freq: Four times a day (QID) | ORAL | Status: DC | PRN
  Administered 2013-12-05 – 2013-12-09 (×4): 500 mg via ORAL
  Filled 2013-12-04 (×4): qty 1

## 2013-12-04 MED ORDER — ETOMIDATE 2 MG/ML IV SOLN
INTRAVENOUS | Status: AC
  Filled 2013-12-04: qty 10

## 2013-12-04 SURGICAL SUPPLY — 49 items
BLADE SAW SGTL 18X1.27X75 (BLADE) ×2 IMPLANT
BLADE SAW SGTL 18X1.27X75MM (BLADE) ×1
CAPT HIP FX BIPOLAR/UNIPOLAR ×3 IMPLANT
COVER SURGICAL LIGHT HANDLE (MISCELLANEOUS) ×3 IMPLANT
DERMABOND ADVANCED (GAUZE/BANDAGES/DRESSINGS) ×2
DERMABOND ADVANCED .7 DNX12 (GAUZE/BANDAGES/DRESSINGS) ×1 IMPLANT
DRAPE INCISE IOBAN 85X60 (DRAPES) ×3 IMPLANT
DRAPE ORTHO SPLIT 77X108 STRL (DRAPES) ×4
DRAPE SURG ORHT 6 SPLT 77X108 (DRAPES) ×2 IMPLANT
DRAPE U-SHAPE 47X51 STRL (DRAPES) ×3 IMPLANT
DRSG AQUACEL AG ADV 3.5X10 (GAUZE/BANDAGES/DRESSINGS) ×3 IMPLANT
DURAPREP 26ML APPLICATOR (WOUND CARE) ×3 IMPLANT
ELECT BLADE 4.0 EZ CLEAN MEGAD (MISCELLANEOUS) ×3
ELECT REM PT RETURN 9FT ADLT (ELECTROSURGICAL) ×3
ELECTRODE BLDE 4.0 EZ CLN MEGD (MISCELLANEOUS) ×1 IMPLANT
ELECTRODE REM PT RTRN 9FT ADLT (ELECTROSURGICAL) ×1 IMPLANT
EVACUATOR 1/8 PVC DRAIN (DRAIN) IMPLANT
FACESHIELD WRAPAROUND (MASK) ×6 IMPLANT
GLOVE BIO SURGEON STRL SZ7 (GLOVE) ×3 IMPLANT
GLOVE BIOGEL PI IND STRL 7.5 (GLOVE) ×2 IMPLANT
GLOVE BIOGEL PI IND STRL 8 (GLOVE) ×3 IMPLANT
GLOVE BIOGEL PI INDICATOR 7.5 (GLOVE) ×4
GLOVE BIOGEL PI INDICATOR 8 (GLOVE) ×6
GLOVE ECLIPSE 8.0 STRL XLNG CF (GLOVE) ×3 IMPLANT
GLOVE ORTHO TXT STRL SZ7.5 (GLOVE) ×6 IMPLANT
GLOVE SURG ORTHO 8.0 STRL STRW (GLOVE) ×3 IMPLANT
GOWN STRL REIN 3XL XLG LVL4 (GOWN DISPOSABLE) ×3 IMPLANT
GOWN STRL REUS W/ TWL LRG LVL3 (GOWN DISPOSABLE) ×3 IMPLANT
GOWN STRL REUS W/TWL LRG LVL3 (GOWN DISPOSABLE) ×6
HANDPIECE INTERPULSE COAX TIP (DISPOSABLE)
IMMOBILIZER KNEE 22 UNIV (SOFTGOODS) ×3 IMPLANT
KIT BASIN OR (CUSTOM PROCEDURE TRAY) ×3 IMPLANT
KIT ROOM TURNOVER OR (KITS) ×3 IMPLANT
MANIFOLD NEPTUNE II (INSTRUMENTS) ×3 IMPLANT
NS IRRIG 1000ML POUR BTL (IV SOLUTION) ×3 IMPLANT
PACK TOTAL JOINT (CUSTOM PROCEDURE TRAY) ×3 IMPLANT
PAD ARMBOARD 7.5X6 YLW CONV (MISCELLANEOUS) ×6 IMPLANT
PAD ONESTEP ZOLL R SERIES ADT (MISCELLANEOUS) ×3 IMPLANT
SET HNDPC FAN SPRY TIP SCT (DISPOSABLE) IMPLANT
SPONGE LAP 4X18 X RAY DECT (DISPOSABLE) ×6 IMPLANT
SUT MNCRL AB 4-0 PS2 18 (SUTURE) ×3 IMPLANT
SUT VIC AB 1 CT1 27 (SUTURE) ×4
SUT VIC AB 1 CT1 27XBRD ANBCTR (SUTURE) ×2 IMPLANT
SUT VIC AB 2-0 CT1 27 (SUTURE) ×4
SUT VIC AB 2-0 CT1 TAPERPNT 27 (SUTURE) ×2 IMPLANT
TOWEL OR 17X24 6PK STRL BLUE (TOWEL DISPOSABLE) ×3 IMPLANT
TOWEL OR 17X26 10 PK STRL BLUE (TOWEL DISPOSABLE) ×3 IMPLANT
TRAY FOLEY CATH 14FR (SET/KITS/TRAYS/PACK) IMPLANT
WATER STERILE IRR 1000ML POUR (IV SOLUTION) ×12 IMPLANT

## 2013-12-04 NOTE — Op Note (Signed)
NAME:  Franisco, Fedorov                ACCOUNT NO.:  [id number]   MEDICAL RECORD NO.: [id number]   LOCATION:  1435                         FACILITY:  [facility]   DATE OF BIRTH:  Dec 16, 2040  PHYSICIAN:  Madlyn Frankel. Charlann Boxer, M.D.     DATE OF PROCEDURE:  12/04/13                               OPERATIVE REPORT     PREOPERATIVE DIAGNOSIS:  Left displaced femoral neck fracture.   POSTOPERATIVE DIAGNOSIS:  Left displaced femoral neck fracture.   PROCEDURE:  Left hip hemiarthroplasty utilizing DePuy component, size 5 standard Summit Basic stem with a 34mm unipolar ball with a +5 adapter.   SURGEON:  Madlyn Frankel. Charlann Boxer, MD   ASSISTANT:  Lanney Gins, PA-C.   ANESTHESIA:  General.   SPECIMENS:  None.   DRAINS:  None.   BLOOD LOSS:  About 50 cc.   COMPLICATIONS:  None.   INDICATION OF PROCEDURE:  Mr Moynahan is a 73 year old male with multiple medical co-morbidities who was just discharged from the hospital to a skilled facility yesterday.  While there he unfortunately fell (ground level).  He was admitted to the hospital after radiographs revealed a femoral neck fracture.  He was seen and evaluated and was scheduled for surgery for fixation.  The necessity of surgical repair was discussed with his family.  Consent was obtained after reviewing risks of infection, DVT, component failure, and need for revision surgery.  He is deemed very high risk but decision was based on palliative measures to help quality of life.   PROCEDURE IN DETAIL:  The patient was brought to the operative theater. Once adequate anesthesia, preoperative antibiotics,  2g of Ancef administered, the patient was positioned into the right lateral decubitus position with the left side up.  The left lower extremity was then prepped and draped in sterile fashion.  A time-out was performed identifying the patient, planned procedure, and extremity.   A lateral incision was made off the proximal trochanter. Sharp dissection was  carried down to the iliotibial band and gluteal fascia. The gluteal fascia was then incised for posterior approach.  The short external rotators were taken down separate from the posterior capsule. An L capsulotomy was made preserving the posterior leaflet for later anatomic repair. Fracture site was identified and after removing comminuted segments of the posterior femoral neck, the femoral head was removed without difficulty and measured on the back table  using the sizing rings and determined to be 48 mm in diameter.   The proximal femur was then exposed.  Retractors placed.  I then drilled, opened the proximal femur.  Then I hand reamed once and  Irrigated the canal to try to prevent fat emboli.  I began broaching the femur with a size 1 broach up to a size 5 broach with good medial and lateral metaphyseal fit without evidence of any torsion or movement.  A trial reduction was carried out with a standard neck and a +0 then +5 adapter with a 83mm ball.  The hip reduced nicely.  The leg lengths appeared to be equal compared to the down leg.   The hip went through a range of motion without evidence of any  subluxation or impingement.   Given these findings, the trial components removed.  The final 5 standard Summit Basic stem was opened.  After irrigating the canal, the final stem was impacted and sat at the level where the broach was. Based on this and the trial reduction, a +5 adapter was opened and impacted in the 48mm unipolar ball onto a clean and dry trunnion.  The hip had been irrigated throughout the case and again at this point.  I re- Approximated the posterior capsule to the superior leaflet using a  #1 Vicryl.  The remainder of the wound was closed with #1 Vicryl in the iliotibial band and gluteal fascia, a  2-0 Vicryl in the sub-Q tissue and a running 4-0 Monocryl in the skin.  The hip was cleaned, dried, and dressed sterilely using Dermabond and Aquacel dressing.  He was  then brought to ICU intubated to be better managed by CCM overnight but stable hemodynamically at time of surgery end.  Lanney GinsMatthew Babish, PA-C was present and utilized as Geophysicist/field seismologistassistant for the entire case from  Preoperative positioning to management of the contralateral extremity and retractors to  General facilitation of the procedure.  He was also involved with primary wound closure.         Madlyn FrankelMatthew D. Charlann Boxerlin, M.D.

## 2013-12-04 NOTE — Anesthesia Procedure Notes (Signed)
Procedure Name: Intubation Date/Time: 12/04/2013 9:45 PM Performed by: Nicholos Johns Pre-anesthesia Checklist: Patient identified, Timeout performed, Emergency Drugs available, Suction available and Patient being monitored Patient Re-evaluated:Patient Re-evaluated prior to inductionOxygen Delivery Method: Circle system utilized Preoxygenation: Pre-oxygenation with 100% oxygen Intubation Type: IV induction and Rapid sequence Ventilation: Mask ventilation without difficulty Laryngoscope size: planned glidescope. Tube type: Subglottic suction tube Tube size: 7.5 mm Number of attempts: 1 Airway Equipment and Method: Video-laryngoscopy Placement Confirmation: ETT inserted through vocal cords under direct vision,  positive ETCO2 and breath sounds checked- equal and bilateral Secured at: 23 cm Tube secured with: Tape Dental Injury: Teeth and Oropharynx as per pre-operative assessment

## 2013-12-04 NOTE — Transfer of Care (Signed)
Immediate Anesthesia Transfer of Care Note  Patient: [name] [name]  Procedure(s) Performed: Procedure(s): ARTHROPLASTY BIPOLAR HIP (Left)  Patient Location: ICU  Anesthesia Type:General  Level of Consciousness: Patient remains intubated per anesthesia plan  Airway & Oxygen Therapy: Patient remains intubated per anesthesia plan and Patient placed on Ventilator (see vital sign flow sheet for setting)  Post-op Assessment: Report given to PACU RN and Post -op Vital signs reviewed and stable  Post vital signs: Reviewed and stable  Complications: No apparent anesthesia complications

## 2013-12-04 NOTE — Consult Note (Signed)
Reason for Consult: Preop Referring Physician:   [name] is an 73 y.o. male.  HPI:   73 yo male with hx of DM, EtOH abuse, dementia admitted on 11/28/13 with SOB, DOE, LE edema, and orthopnea.  He was diuresed. EF 15% with grade two diastolic dysf. Mod TR. Peak PA pressure 32mHg.  Dr. CBurt Knackhad a discussion with the patient's daughter and they do not wish to pursue aggressive cardiac interventions.   He was discharged yesterday to skilled nursing and presents back today with a left femoral neck fracture.  He reports walking down the hall and loosing his balance then falling.  No dizziness or presyncope symptoms.  He is currently in a lot of pain.  The patient currently denies nausea, vomiting, fever, chest pain, shortness of breath, orthopnea, dizziness, PND, cough, congestion, abdominal pain, hematochezia, melena, lower extremity edema.    Past Medical History  Diagnosis Date  . Diabetes mellitus without complication   . Pancreatic disorder     Past Surgical History  Procedure Laterality Date  . Tonsillectomy      Family History  Problem Relation Age of Onset  . Breast cancer Mother   . CAD Father   . Cancer - Other Sister     Laryngeal    Social History:  reports that he quit smoking about 8 months ago. He has never used smokeless tobacco. He reports that he drinks about 12.6 ounces of alcohol per week. He reports that he does not use illicit drugs.  Allergies: No Known Allergies  Medications: Prior to Admission medications   Medication Sig Start Date End Date Taking? Authorizing Provider  bisacodyl (DULCOLAX) 10 MG suppository Place 10 mg rectally daily as needed for moderate constipation.   Yes Historical Provider, MD  carvedilol (COREG) 3.125 MG tablet Take 1 tablet (3.125 mg total) by mouth 2 (two) times daily with a meal. 12/03/13  Yes ACharlynne Cousins MD  Cholecalciferol (VITAMIN D-3) 1000 UNITS CAPS Take 1,000 Units by mouth daily.   Yes Historical  Provider, MD  FLUoxetine (PROZAC) 20 MG capsule Take 20 mg by mouth daily.   Yes Historical Provider, MD  furosemide (LASIX) 20 MG tablet Take 1 tablet (20 mg total) by mouth daily. 12/03/13  Yes ACharlynne Cousins MD  glipiZIDE (GLUCOTROL) 10 MG tablet Take 0.5 tablets (5 mg total) by mouth 2 (two) times daily before a meal. 12/03/13  Yes ACharlynne Cousins MD  lipase/protease/amylase (CREON-12/PANCREASE) 12000 UNITS CPEP Take 1 capsule by mouth 3 (three) times daily with meals.    Yes Historical Provider, MD  lisinopril (PRINIVIL,ZESTRIL) 5 MG tablet Take 1 tablet (5 mg total) by mouth daily. 12/03/13  Yes ACharlynne Cousins MD  magnesium hydroxide (MILK OF MAGNESIA) 400 MG/5ML suspension Take 30 mLs by mouth daily as needed for mild constipation.   Yes Historical Provider, MD  Multiple Vitamins-Minerals (MULTIVITAMIN WITH MINERALS) tablet Take 1 tablet by mouth daily.   Yes Historical Provider, MD  nicotine (NICODERM CQ - DOSED IN MG/24 HOURS) 21 mg/24hr patch Place 21 mg onto the skin daily.   Yes Historical Provider, MD  pregabalin (LYRICA) 50 MG capsule Take 1 capsule (50 mg total) by mouth 3 (three) times daily. 12/03/13  Yes Tiffany L Reed, DO  simvastatin (ZOCOR) 20 MG tablet Take 1 tablet (20 mg total) by mouth daily at 6 PM. 12/03/13  Yes ACharlynne Cousins MD  spironolactone (ALDACTONE) 12.5 mg TABS tablet Take 0.5 tablets (12.5 mg  total) by mouth daily. 12/03/13  Yes Charlynne Cousins, MD     Results for orders placed during the hospital encounter of 12/04/13 (from the past 48 hour(s))  CBG MONITORING, ED     Status: Abnormal   Collection Time    12/04/13 10:53 AM      Result Value Ref Range   Glucose-Capillary 162 (*) 70 - 99 mg/dL  CBC WITH DIFFERENTIAL     Status: Abnormal   Collection Time    12/04/13 12:17 PM      Result Value Ref Range   WBC 5.3  4.0 - 10.5 K/uL   RBC 3.28 (*) 4.22 - 5.81 MIL/uL   Hemoglobin 10.2 (*) 13.0 - 17.0 g/dL   HCT 28.6 (*) 39.0 - 52.0 %   MCV  87.2  78.0 - 100.0 fL   MCH 31.1  26.0 - 34.0 pg   MCHC 35.7  30.0 - 36.0 g/dL   RDW 12.7  11.5 - 15.5 %   Platelets 200  150 - 400 K/uL   Neutrophils Relative % 71  43 - 77 %   Neutro Abs 3.8  1.7 - 7.7 K/uL   Lymphocytes Relative 19  12 - 46 %   Lymphs Abs 1.0  0.7 - 4.0 K/uL   Monocytes Relative 8  3 - 12 %   Monocytes Absolute 0.4  0.1 - 1.0 K/uL   Eosinophils Relative 2  0 - 5 %   Eosinophils Absolute 0.1  0.0 - 0.7 K/uL   Basophils Relative 0  0 - 1 %   Basophils Absolute 0.0  0.0 - 0.1 K/uL  BASIC METABOLIC PANEL     Status: Abnormal   Collection Time    12/04/13 12:17 PM      Result Value Ref Range   Sodium 124 (*) 137 - 147 mEq/L   Potassium 5.9 (*) 3.7 - 5.3 mEq/L   Chloride 89 (*) 96 - 112 mEq/L   CO2 25  19 - 32 mEq/L   Glucose, Bld 145 (*) 70 - 99 mg/dL   BUN 24 (*) 6 - 23 mg/dL   Creatinine, Ser 0.82  0.50 - 1.35 mg/dL   Calcium 7.9 (*) 8.4 - 10.5 mg/dL   GFR calc non Af Amer 88 (*) >90 mL/min   GFR calc Af Amer >90  >90 mL/min   Comment: (NOTE)     The eGFR has been calculated using the CKD EPI equation.     This calculation has not been validated in all clinical situations.     eGFR's persistently <90 mL/min signify possible Chronic Kidney     Disease.  URINALYSIS, ROUTINE W REFLEX MICROSCOPIC     Status: Abnormal   Collection Time    12/04/13 12:25 PM      Result Value Ref Range   Color, Urine YELLOW  YELLOW   APPearance CLEAR  CLEAR   Specific Gravity, Urine 1.024  1.005 - 1.030   pH 5.5  5.0 - 8.0   Glucose, UA NEGATIVE  NEGATIVE mg/dL   Hgb urine dipstick NEGATIVE  NEGATIVE   Bilirubin Urine NEGATIVE  NEGATIVE   Ketones, ur 15 (*) NEGATIVE mg/dL   Protein, ur NEGATIVE  NEGATIVE mg/dL   Urobilinogen, UA 0.2  0.0 - 1.0 mg/dL   Nitrite NEGATIVE  NEGATIVE   Leukocytes, UA TRACE (*) NEGATIVE  URINE MICROSCOPIC-ADD ON     Status: Abnormal   Collection Time    12/04/13 12:25 PM  Result Value Ref Range   Squamous Epithelial / LPF RARE  RARE    WBC, UA 0-2  <3 WBC/hpf   Bacteria, UA RARE  RARE   Casts HYALINE CASTS (*) NEGATIVE    Dg Hip Complete Left  12/04/2013   CLINICAL DATA:  Fall  EXAM: LEFT HIP - COMPLETE 2+ VIEW  COMPARISON:  None.  FINDINGS: Fractures of the left femoral neck with some impaction and angulation. Mild displacement. Hip joint appears normal on the left. Chronic healed fracture left ischial healed tuberosity. .  IMPRESSION: Left femoral neck fracture.   Electronically Signed   By: Franchot Gallo M.D.   On: 12/04/2013 12:14   Dg Chest Portable 1 View  12/04/2013   CLINICAL DATA:  FALL HIP INJURY  EXAM: PORTABLE CHEST - 1 VIEW  COMPARISON:  CT CHEST W/CM dated 11/29/2013  FINDINGS: Cardiac silhouette is enlarged. A right pleural effusion is appreciated tracking along the lateral chest wall. A smaller left effusion is identified. Areas of increased density projects within the lung bases. There is prominence of the interstitial markings. A wedge-shaped area of increased density extends from the right hilar region and appears to track along the minor minor fissure likely representing fluid. Degenerative changes are appreciated within the shoulders.  IMPRESSION: Interstitial findings likely reflecting pulmonary edema.  Bilateral pleural effusions right greater than left  Likely fluid along the minor fissure  Likely bibasilar atelectasis.   Electronically Signed   By: Margaree Mackintosh M.D.   On: 12/04/2013 13:53    Review of Systems  Constitutional: Negative for fever.  HENT: Negative for congestion.   Respiratory: Negative for cough and shortness of breath.   Cardiovascular: Negative for chest pain, orthopnea, leg swelling and PND.  Gastrointestinal: Negative for nausea, vomiting, abdominal pain and blood in stool.  Musculoskeletal: Positive for falls and myalgias.       Severe pain in left lower extremity  Neurological: Negative for dizziness.  All other systems reviewed and are negative.   Blood pressure 105/71, pulse  65, temperature 97.9 F (36.6 C), resp. rate 21, SpO2 94.00%. Physical Exam  Nursing note and vitals reviewed. Constitutional: He is oriented to person, place, and time. He appears well-developed. He appears distressed (Pain).  Frail appearing, nearly cachectic  Eyes: EOM are normal. Pupils are equal, round, and reactive to light. No scleral icterus.  Neck: Normal range of motion. Neck supple. No JVD present.  Cardiovascular: Normal rate, regular rhythm, S1 normal and S2 normal.   Pulses:      Radial pulses are 2+ on the right side, and 2+ on the left side.       Dorsalis pedis pulses are 1+ on the right side, and 1+ on the left side.       Posterior tibial pulses are 1+ on the right side, and 1+ on the left side.  Respiratory: Effort normal. He has wheezes. He has no rales.  GI: Soft. Bowel sounds are normal. There is no tenderness.  Musculoskeletal: He exhibits no edema.  No LEE   Neurological: He is alert and oriented to person, place, and time.  Skin: Skin is warm and dry.  Psychiatric: He has a normal mood and affect.    Assessment/Plan:  Principal Problem:   1.  Hip fracture, left  Active Problems:   2.  Chronic combined systolic and diastolic CHF (congestive heart failure):  Echo 11/29/13:  EF 15%, Grade  two diastolic disfunction. Continue Coreg. Hold Ace-I if continued hypotension.  Avoid fluid resuscitation post-op.  Strict I/O's and daily weights.  No plan for further cardiac evaluation.     3.  Type II or unspecified type diabetes mellitus without mention of complication, not stated as uncontrolled  Mgt per IM    4.  Hypotension  Hold ACE-I.      5.  Pleural effusions: Right greater than    PREOPERATIVE CARDIAC RISK ASSESSMENT   Revised Cardiac Risk Index:  High Risk Surgery: yes;   Defined as Intraperitoneal, intrathoracic or suprainguinal vascular  Active CAD: yes;   CHF: yes;   Cerebrovascular Disease: no;   Diabetes: yes; On Insulin: no  CKD (Cr  >~ 2): no;   Total: 3 Estimated Risk of Adverse Outcome: Moderate to high risk Estimated Risk of MI, PE, VF/VT (Cardiac Arrest),  Complete Heart Block: 6.6-11%   HAGER, BRYAN 12/04/2013, 2:40 PM    I have seen and evaluated the patient this AM along with Tarri Fuller, PA. I agree with his findings, examination as well as impression recommendations.  73 y/o man with recently diagnosed severe Cardiomyopathy who was just d/c'd to rehab after diuresis. He now presents with L Hip Fracture.  [facility] has been asked to consult for pre-operative risk assessment.    Please see the elegantly displayed assessment above -- Moderate to High Risk patient for Intermediate to High Risk surgery.  Unfortunately, the need for surgery is relatively urgent.  He has had a cardiac evaluation from his recent hospitalization.  The decision was made to forgo an ischemic evaluation based upon patient & family wishes.  At this point, he is not actively having angina, so an ischemic evaluation would not likely alter out treatment plans beyond delaying a needed operation.    The recommendation would be to proceed to the OR without additional workup --   will need close peri-operative monitoring with severe CM.    Continue his BB, but D/C ACE-I (to avoid hypotension & for HyperKalemai).    With HypoNatremia - this does not appear to be hypervolemic or hypovolemic (but did drop with Lasix).  Would hold Lasix for now (but avoid XS fluid hydration).    We will be happy to follow along.  Leonie Man, M.D., M.S. Interventional Cardiologist  [place] Pager # [phone] 12/04/2013

## 2013-12-04 NOTE — Anesthesia Postprocedure Evaluation (Signed)
  Anesthesia Post-op Note  Patient: [name] [name]  Procedure(s) Performed: Procedure(s): ARTHROPLASTY BIPOLAR HIP (Left)  Patient Location: PACU and ICU  Anesthesia Type:General  Level of Consciousness: sedated  Airway and Oxygen Therapy: Patient remains intubated per anesthesia plan  Post-op Pain: mild  Post-op Assessment: Post-op Vital signs reviewed  Post-op Vital Signs: Reviewed  Last Vitals:  Filed Vitals:   12/04/13 2330  BP:   Pulse: 80  Temp:   Resp: 14    Complications: No apparent anesthesia complications

## 2013-12-04 NOTE — OR Nursing (Addendum)
Zoll Defib. Pads applied pre-operative prior to surgical procedure start.  Called [phone]  @2250 

## 2013-12-04 NOTE — ED Notes (Addendum)
Pt from [facility] via [facility] with c/o fall and left hip deformity.  Pt has hx of dementia with periods of confuision.  Pt is not suppose to walk and tripped falling.  Family member witnessed fall, did not hit head or LOC, and reports a 8 minute period of staring and rigid body position with not being able to verbalize.  Pt has been having neck pain for several days.  Given 250 mL NS.  Pt in NAD.

## 2013-12-04 NOTE — Progress Notes (Signed)
Admitting nurse coming to do the admission history.

## 2013-12-04 NOTE — ED Provider Notes (Signed)
CSN: [id number]     Arrival date & time 12/04/13  1037 History   First MD Initiated Contact with Patient 12/04/13 1042     Chief Complaint  Patient presents with  . Fall  . Hip Injury     (Consider location/radiation/quality/duration/timing/severity/associated sxs/prior Treatment) HPI  72yM with L hip pain. Had fall. Apparently no supposed to be ambulating but tried and fell. Persistent L hip pain since. Pt with dementia and unable to provide any useful history. Just discharged from hospital. Severe CM.  Per family at bedside, appears to be in pain but otherwise not significantly altered from baseline. Does have advanced dementia. No blood thinners.   Past Medical History  Diagnosis Date  . Diabetes mellitus without complication   . Pancreatic disorder    Past Surgical History  Procedure Laterality Date  . Tonsillectomy     Family History  Problem Relation Age of Onset  . Breast cancer Mother   . CAD Father   . Cancer - Other Sister     Laryngeal   History  Substance Use Topics  . Smoking status: Former Smoker    Quit date: 03/21/2013  . Smokeless tobacco: Never Used  . Alcohol Use: 12.6 oz/week    21 Cans of beer per week    Review of Systems  All systems reviewed and negative, other than as noted in HPI.   Allergies  Review of patient's allergies indicates no known allergies.  Home Medications   Current Outpatient Rx  Name  Route  Sig  Dispense  Refill  . bisacodyl (DULCOLAX) 10 MG suppository   Rectal   Place 10 mg rectally daily as needed for moderate constipation.         . carvedilol (COREG) 3.125 MG tablet   Oral   Take 1 tablet (3.125 mg total) by mouth 2 (two) times daily with a meal.         . Cholecalciferol (VITAMIN D-3) 1000 UNITS CAPS   Oral   Take 1,000 Units by mouth daily.         Marland Kitchen FLUoxetine (PROZAC) 20 MG capsule   Oral   Take 20 mg by mouth daily.         . furosemide (LASIX) 20 MG tablet   Oral   Take 1 tablet (20 mg  total) by mouth daily.   30 tablet   0   . glipiZIDE (GLUCOTROL) 10 MG tablet   Oral   Take 0.5 tablets (5 mg total) by mouth 2 (two) times daily before a meal.         . lipase/protease/amylase (CREON-12/PANCREASE) 12000 UNITS CPEP   Oral   Take 1 capsule by mouth 3 (three) times daily with meals.          Marland Kitchen lisinopril (PRINIVIL,ZESTRIL) 5 MG tablet   Oral   Take 1 tablet (5 mg total) by mouth daily.         . magnesium hydroxide (MILK OF MAGNESIA) 400 MG/5ML suspension   Oral   Take 30 mLs by mouth daily as needed for mild constipation.         . Multiple Vitamins-Minerals (MULTIVITAMIN WITH MINERALS) tablet   Oral   Take 1 tablet by mouth daily.         . nicotine (NICODERM CQ - DOSED IN MG/24 HOURS) 21 mg/24hr patch   Transdermal   Place 21 mg onto the skin daily.         . pregabalin (LYRICA) 50  MG capsule   Oral   Take 1 capsule (50 mg total) by mouth 3 (three) times daily.   90 capsule   5   . simvastatin (ZOCOR) 20 MG tablet   Oral   Take 1 tablet (20 mg total) by mouth daily at 6 PM.   30 tablet      . spironolactone (ALDACTONE) 12.5 mg TABS tablet   Oral   Take 0.5 tablets (12.5 mg total) by mouth daily.          BP 134/70  Pulse 30  Temp(Src) 97.9 F (36.6 C)  Resp 13  SpO2 75% Physical Exam  Nursing note and vitals reviewed. Constitutional: He appears well-developed and well-nourished.  Laying in bed. Appears uncomfortable.   HENT:  Head: Normocephalic and atraumatic.  Eyes: Conjunctivae are normal. Right eye exhibits no discharge. Left eye exhibits no discharge.  Neck: Neck supple.  Cardiovascular: Normal rate, regular rhythm and normal heart sounds.  Exam reveals no gallop and no friction rub.   No murmur heard. Pulmonary/Chest: Effort normal and breath sounds normal. No respiratory distress.  Abdominal: Soft. He exhibits no distension. There is no tenderness.  Musculoskeletal: He exhibits no edema and no tenderness.  LLE  shortened and externally rotated. Severe pain with attempted ROM of L hip. Foot warm to touch. Palpable DP pulse.   Neurological:  Confused. Follows some simple commands.   Skin: Skin is warm and dry.  Psychiatric: He has a normal mood and affect. His behavior is normal. Thought content normal.    ED Course  Procedures (including critical care time) Labs Review Labs Reviewed  CBC WITH DIFFERENTIAL - Abnormal; Notable for the following:    RBC 3.28 (*)    Hemoglobin 10.2 (*)    HCT 28.6 (*)    All other components within normal limits  CBG MONITORING, ED - Abnormal; Notable for the following:    Glucose-Capillary 162 (*)    All other components within normal limits  BASIC METABOLIC PANEL  URINALYSIS, ROUTINE W REFLEX MICROSCOPIC   Imaging Review Dg Hip Complete Left  12/04/2013   CLINICAL DATA:  Fall  EXAM: LEFT HIP - COMPLETE 2+ VIEW  COMPARISON:  None.  FINDINGS: Fractures of the left femoral neck with some impaction and angulation. Mild displacement. Hip joint appears normal on the left. Chronic healed fracture left ischial healed tuberosity. .  IMPRESSION: Left femoral neck fracture.   Electronically Signed   By: Marlan Palauharles  Clark M.D.   On: 12/04/2013 12:14     EKG Interpretation   Date/Time:  Tuesday December 04 2013 13:52:10 EDT Ventricular Rate:  67 PR Interval:  157 QRS Duration: 156 QT Interval:  500 QTC Calculation: 528 R Axis:   1 Text Interpretation:  Sinus rhythm Right bundle branch block Abnormal T,  consider ischemia, lateral leads Baseline wander in lead(s) V3 ED  PHYSICIAN INTERPRETATION AVAILABLE IN [facility] Confirmed by TEST,  Record ([id number]) on 12/06/2013 8:20:08 AM      MDM   Final diagnoses:  Hip fracture, left    72yM with L hip fx. Severe CM. Will need medical clearance. Discussed with ortho and medicine and cardiology. Admit.     Raeford RazorStephen Sibbie Flammia, MD 12/09/13 1901

## 2013-12-04 NOTE — Consult Note (Signed)
Reason for Consult:Left femoral neck fracture Referring Physician: [place], Lonia Farber, MD ([facility])  [name] [name] is an 73 y.o. male.  HPI: 83 male with significant medical history recently discharged from [facility] to SNF with newly diagnosed severe CHF (EF - 15%).  He had a fall at the facility today, ground level with immediate onset left hip pain, inability to bear weight.  Brought to ER where workup revealed left femoral neck fracture.  Admitted to medicine, cardiology consulted, orthopaedics consulted for definitive management of his left hip. Recent medical history reviewed with his daughter. No other injuries to report  Past Medical History  Diagnosis Date  . Diabetes mellitus without complication   . Pancreatic disorder   . Cardiomyopathy, dilated   . Chronic combined systolic and diastolic CHF (congestive heart failure):  Echo 11/29/13:  EF 15%, Grade two diastolic disfunction. 11/28/2013  . Hypertension 11/29/2013  . Protein-calorie malnutrition, severe 12/01/2013    Past Surgical History  Procedure Laterality Date  . Tonsillectomy      Family History  Problem Relation Age of Onset  . Breast cancer Mother   . CAD Father   . Cancer - Other Sister     Laryngeal    Social History:  reports that he quit smoking about 8 months ago. He has never used smokeless tobacco. He reports that he drinks about 12.6 ounces of alcohol per week. He reports that he does not use illicit drugs.  Allergies: No Known Allergies  Medications:  I have reviewed the patient's current medications. Scheduled: . [START ON 12/05/2013] carvedilol  3.125 mg Oral BID WC  . enoxaparin (LOVENOX) injection  30 mg Subcutaneous Q24H  . [START ON 12/05/2013] lipase/protease/amylase  1 capsule Oral TID WC  . multivitamin with minerals  1 tablet Oral Daily  . pregabalin  50 mg Oral TID  . [START ON 12/05/2013] simvastatin  20 mg Oral q1800    Results for orders placed during the hospital encounter of 12/04/13 (from the  past 24 hour(s))  CBG MONITORING, ED     Status: Abnormal   Collection Time    12/04/13 10:53 AM      Result Value Ref Range   Glucose-Capillary 162 (*) 70 - 99 mg/dL  CBC WITH DIFFERENTIAL     Status: Abnormal   Collection Time    12/04/13 12:17 PM      Result Value Ref Range   WBC 5.3  4.0 - 10.5 K/uL   RBC 3.28 (*) 4.22 - 5.81 MIL/uL   Hemoglobin 10.2 (*) 13.0 - 17.0 g/dL   HCT 73.7 (*) 10.6 - 26.9 %   MCV 87.2  78.0 - 100.0 fL   MCH 31.1  26.0 - 34.0 pg   MCHC 35.7  30.0 - 36.0 g/dL   RDW 48.5  46.2 - 70.3 %   Platelets 200  150 - 400 K/uL   Neutrophils Relative % 71  43 - 77 %   Neutro Abs 3.8  1.7 - 7.7 K/uL   Lymphocytes Relative 19  12 - 46 %   Lymphs Abs 1.0  0.7 - 4.0 K/uL   Monocytes Relative 8  3 - 12 %   Monocytes Absolute 0.4  0.1 - 1.0 K/uL   Eosinophils Relative 2  0 - 5 %   Eosinophils Absolute 0.1  0.0 - 0.7 K/uL   Basophils Relative 0  0 - 1 %   Basophils Absolute 0.0  0.0 - 0.1 K/uL  BASIC METABOLIC PANEL  Status: Abnormal   Collection Time    12/04/13 12:17 PM      Result Value Ref Range   Sodium 124 (*) 137 - 147 mEq/L   Potassium 5.9 (*) 3.7 - 5.3 mEq/L   Chloride 89 (*) 96 - 112 mEq/L   CO2 25  19 - 32 mEq/L   Glucose, Bld 145 (*) 70 - 99 mg/dL   BUN 24 (*) 6 - 23 mg/dL   Creatinine, Ser 1.610.82  0.50 - 1.35 mg/dL   Calcium 7.9 (*) 8.4 - 10.5 mg/dL   GFR calc non Af Amer 88 (*) >90 mL/min   GFR calc Af Amer >90  >90 mL/min  URINALYSIS, ROUTINE W REFLEX MICROSCOPIC     Status: Abnormal   Collection Time    12/04/13 12:25 PM      Result Value Ref Range   Color, Urine YELLOW  YELLOW   APPearance CLEAR  CLEAR   Specific Gravity, Urine 1.024  1.005 - 1.030   pH 5.5  5.0 - 8.0   Glucose, UA NEGATIVE  NEGATIVE mg/dL   Hgb urine dipstick NEGATIVE  NEGATIVE   Bilirubin Urine NEGATIVE  NEGATIVE   Ketones, ur 15 (*) NEGATIVE mg/dL   Protein, ur NEGATIVE  NEGATIVE mg/dL   Urobilinogen, UA 0.2  0.0 - 1.0 mg/dL   Nitrite NEGATIVE  NEGATIVE    Leukocytes, UA TRACE (*) NEGATIVE  URINE MICROSCOPIC-ADD ON     Status: Abnormal   Collection Time    12/04/13 12:25 PM      Result Value Ref Range   Squamous Epithelial / LPF RARE  RARE   WBC, UA 0-2  <3 WBC/hpf   Bacteria, UA RARE  RARE   Casts HYALINE CASTS (*) NEGATIVE  PRO B NATRIURETIC PEPTIDE     Status: Abnormal   Collection Time    12/04/13  2:49 PM      Result Value Ref Range   Pro B Natriuretic peptide (BNP) 5148.0 (*) 0 - 125 pg/mL  TROPONIN I     Status: None   Collection Time    12/04/13  2:49 PM      Result Value Ref Range   Troponin I <0.30  <0.30 ng/mL    X-ray: CLINICAL DATA: Fall  EXAM:  LEFT HIP - COMPLETE 2+ VIEW  COMPARISON: None.  FINDINGS:  Fractures of the left femoral neck with some impaction and  angulation. Mild displacement. Hip joint appears normal on the left.  Chronic healed fracture left ischial healed tuberosity. .  IMPRESSION:  Left femoral neck fracture.  Electronically Signed  By: Marlan Palauharles Clark M.D.   ROS Review of Systems  Constitutional: Negative for fever.  HENT: Negative for congestion.  Respiratory: Negative for cough and shortness of breath.  Cardiovascular: Negative for chest pain, orthopnea, leg swelling and PND.  Gastrointestinal: Negative for nausea, vomiting, abdominal pain and blood in stool.  Musculoskeletal: Positive for falls and myalgias.  Severe pain in left lower extremity  Neurological: Negative for dizziness.  All other systems reviewed and are negative.   Blood pressure 148/73, pulse 84, temperature 98.9 F (37.2 C), temperature source Oral, resp. rate 16, height 5\' 9"  (1.753 m), weight 136.1 kg (300 lb 0.7 oz), SpO2 100.00%.  Physical Exam Sleeping related to pain meds given recently, daughter states that other wise he had been crying out due to pain Left LE deferred to create pain  Medical history reviewed from admission and cardiology notes  Assessment/Plan: Displaced left femoral neck  fracture After discussion with family about options and alternatives they very much would like to proceed with surgery for hemiarthroplasty for palliative purposes Understand that he is very high risk Consent filled out  Labs reviewed Cardiology note reviewed  To OR tonight for left hip hemiarthroplasty  Damani Rando D 12/04/2013, 7:38 PM

## 2013-12-04 NOTE — Anesthesia Preprocedure Evaluation (Addendum)
Anesthesia Evaluation  Patient identified by MRN, date of birth, ID band Patient confused  General Assessment Comment:Discussed case with daughter and ex-wife that patient is a high risk surgical candidate and highly likelihood of post op ventilation and ICU admission. Family understood. CE  Reviewed: Allergy & Precautions, H&P , NPO status , Patient's Chart, lab work & pertinent test results, reviewed documented beta blocker date and time   History of Anesthesia Complications Negative for: history of anesthetic complications  Airway Mallampati: II  Neck ROM: Full    Dental  (+) Dental Advisory Given, Partial Upper, Partial Lower   Pulmonary shortness of breath, Current Smoker,          Cardiovascular hypertension, Pt. on medications and Pt. on home beta blockers +CHF     Neuro/Psych    GI/Hepatic (+)     substance abuse  alcohol use,   Endo/Other  diabetes  Renal/GU      Musculoskeletal   Abdominal   Peds  Hematology   Anesthesia Other Findings   Reproductive/Obstetrics                        Anesthesia Physical Anesthesia Plan  ASA: IV and emergent  Anesthesia Plan: General   Post-op Pain Management:    Induction: Intravenous  Airway Management Planned: Oral ETT  Additional Equipment:   Intra-op Plan:   Post-operative Plan: Possible Post-op intubation/ventilation  Informed Consent: I have reviewed the patients History and Physical, chart, labs and discussed the procedure including the risks, benefits and alternatives for the proposed anesthesia with the patient or authorized representative who has indicated his/her understanding and acceptance.   Dental advisory given  Plan Discussed with: CRNA, Anesthesiologist and Surgeon  Anesthesia Plan Comments:         Anesthesia Quick Evaluation

## 2013-12-04 NOTE — H&P (Signed)
[facility] History and Physical  [name] [name] IHW:[id number] DOB: [birth year] DOA: 12/04/2013  Referring physician: Dr Wilson Singer PCP: Bartholome Bill, MD   Chief Complaint: fall, left hip pain.   HPI: [name] [name] is a 73 y.o. male with PMH significant for Combine diastolic, systolic Heart Failure EF 15 %, Diabetes, Prior history alcohol abuse, who was discharge from hospital 4-6 after getting treatment for Acute heart Failure exacerbation. He was discharge to SNF. Patient fell at [facility]. He was found to have left hip fracture.  In the ED he was found to be hypotensive SBP in the 70 range, hypoxemia. SBP improved after IV bolus.  He has eyes close, he answer some questions.  He doesn't know if he is having SOB. His only complain is left hip pain.    Review of Systems:  Negative except as per HPI.    Past Medical History  Diagnosis Date  . Diabetes mellitus without complication   . Pancreatic disorder         Systolic Heart Failure Ef 15 %, diastolic Heart Failure.        History of alcohol abuse.       Pleural effusion        Incidental lung nodule, > 37m and < 831m      Other specified alcohol-induced mental disorders(  Past Surgical History  Procedure Laterality Date  . Tonsillectomy     Social History:  reports that he quit smoking about 8 months ago. He has never used smokeless tobacco. He reports that he drinks about 12.6 ounces of alcohol per week. He reports that he does not use illicit drugs.  No Known Allergies  Family History  Problem Relation Age of Onset  . Breast cancer Mother   . CAD Father   . Cancer - Other Sister     Laryngeal     Prior to Admission medications   Medication Sig Start Date End Date Taking? Authorizing Provider  bisacodyl (DULCOLAX) 10 MG suppository Place 10 mg rectally daily as needed for moderate constipation.   Yes Historical Provider, MD  carvedilol (COREG) 3.125 MG tablet Take 1 tablet (3.125 mg total) by mouth 2 (two)  times daily with a meal. 12/03/13  Yes AbCharlynne CousinsMD  Cholecalciferol (VITAMIN D-3) 1000 UNITS CAPS Take 1,000 Units by mouth daily.   Yes Historical Provider, MD  FLUoxetine (PROZAC) 20 MG capsule Take 20 mg by mouth daily.   Yes Historical Provider, MD  furosemide (LASIX) 20 MG tablet Take 1 tablet (20 mg total) by mouth daily. 12/03/13  Yes AbCharlynne CousinsMD  glipiZIDE (GLUCOTROL) 10 MG tablet Take 0.5 tablets (5 mg total) by mouth 2 (two) times daily before a meal. 12/03/13  Yes AbCharlynne CousinsMD  lipase/protease/amylase (CREON-12/PANCREASE) 12000 UNITS CPEP Take 1 capsule by mouth 3 (three) times daily with meals.    Yes Historical Provider, MD  lisinopril (PRINIVIL,ZESTRIL) 5 MG tablet Take 1 tablet (5 mg total) by mouth daily. 12/03/13  Yes AbCharlynne CousinsMD  magnesium hydroxide (MILK OF MAGNESIA) 400 MG/5ML suspension Take 30 mLs by mouth daily as needed for mild constipation.   Yes Historical Provider, MD  Multiple Vitamins-Minerals (MULTIVITAMIN WITH MINERALS) tablet Take 1 tablet by mouth daily.   Yes Historical Provider, MD  nicotine (NICODERM CQ - DOSED IN MG/24 HOURS) 21 mg/24hr patch Place 21 mg onto the skin daily.   Yes Historical Provider, MD  pregabalin (LYRICA) 50 MG capsule Take 1 capsule (  50 mg total) by mouth 3 (three) times daily. 12/03/13  Yes Tiffany L Reed, DO  simvastatin (ZOCOR) 20 MG tablet Take 1 tablet (20 mg total) by mouth daily at 6 PM. 12/03/13  Yes Charlynne Cousins, MD  spironolactone (ALDACTONE) 12.5 mg TABS tablet Take 0.5 tablets (12.5 mg total) by mouth daily. 12/03/13  Yes Charlynne Cousins, MD   Physical Exam: Filed Vitals:   12/04/13 1430  BP: 115/68  Pulse: 71  Temp:   Resp: 22    BP 115/68  Pulse 71  Temp(Src) 97.9 F (36.6 C)  Resp 22  SpO2 100%  General:  Appears in pain.  Eyes: PERRL, normal lids, irises & conjunctiva ENT: grossly normal hearing, lips & tongue Neck: no LAD, masses or thyromegaly Cardiovascular: RRR,  no m/r/g. No LE edema. Telemetry: SR, no arrhythmias  Respiratory:  Normal respiratory effort. Bilateral crackles.  Abdomen: soft, ntnd Musculoskeletal: left LE with deviation.  Neurologic: Patient with eyes close, answer some questions. Move upper extremities passively.           Labs on Admission:  Basic Metabolic Panel:  Recent Labs Lab 11/28/13 2235 11/30/13 0411 12/01/13 0328 12/02/13 0508 12/02/13 1400 12/03/13 1003 12/04/13 1217  NA 131* 130* 127* 124* 125* 126* 124*  K 4.2 4.8 4.2 4.8 4.4 5.9* 5.9*  CL 91* 90* 87* 87* 85* 87* 89*  CO2 '27 27 24 26 27 28 25  ' GLUCOSE 81 72 128* 105* 126* 100* 145*  BUN '12 13 13 18 20 22 ' 24*  CREATININE 0.70 0.68 0.56 0.64 0.69 0.65 0.82  CALCIUM 9.3 9.2 8.2* 8.2* 8.7 8.5 7.9*  MG  --  1.7  --   --   --   --   --   PHOS  --  3.5  --   --   --   --   --    Liver Function Tests: No results found for this basename: AST, ALT, ALKPHOS, BILITOT, PROT, ALBUMIN,  in the last 168 hours No results found for this basename: LIPASE, AMYLASE,  in the last 168 hours No results found for this basename: AMMONIA,  in the last 168 hours CBC:  Recent Labs Lab 11/30/13 0411 12/01/13 0328 12/04/13 1217  WBC 5.5 4.7 5.3  NEUTROABS  --   --  3.8  HGB 11.6* 11.4* 10.2*  HCT 32.5* 31.7* 28.6*  MCV 87.1 86.1 87.2  PLT 258 230 200   Cardiac Enzymes:  Recent Labs Lab 11/28/13 1920  TROPONINI <0.30    BNP (last 3 results)  Recent Labs  12/01/13 0328  PROBNP 9675.0*   CBG:  Recent Labs Lab 12/02/13 1622 12/02/13 2145 12/03/13 0614 12/03/13 1118 12/04/13 1053  GLUCAP 151* 70 102* 195* 162*    Radiological Exams on Admission: Dg Hip Complete Left  12/04/2013   CLINICAL DATA:  Fall  EXAM: LEFT HIP - COMPLETE 2+ VIEW  COMPARISON:  None.  FINDINGS: Fractures of the left femoral neck with some impaction and angulation. Mild displacement. Hip joint appears normal on the left. Chronic healed fracture left ischial healed tuberosity. .   IMPRESSION: Left femoral neck fracture.   Electronically Signed   By: Franchot Gallo M.D.   On: 12/04/2013 12:14   Dg Chest Portable 1 View  12/04/2013   CLINICAL DATA:  FALL HIP INJURY  EXAM: PORTABLE CHEST - 1 VIEW  COMPARISON:  CT CHEST W/CM dated 11/29/2013  FINDINGS: Cardiac silhouette is enlarged. A right pleural effusion is appreciated  tracking along the lateral chest wall. A smaller left effusion is identified. Areas of increased density projects within the lung bases. There is prominence of the interstitial markings. A wedge-shaped area of increased density extends from the right hilar region and appears to track along the minor minor fissure likely representing fluid. Degenerative changes are appreciated within the shoulders.  IMPRESSION: Interstitial findings likely reflecting pulmonary edema.  Bilateral pleural effusions right greater than left  Likely fluid along the minor fissure  Likely bibasilar atelectasis.   Electronically Signed   By: Margaree Mackintosh M.D.   On: 12/04/2013 13:53    EKG: Independently reviewed. T wave inversion V 4 to 6.   Assessment/Plan Active Problems:   CHF (congestive heart failure)   Type II or unspecified type diabetes mellitus without mention of complication, not stated as uncontrolled   Incidental lung nodule, > 68m and < 823m  Hip fracture, left   Hypotension   Hip fracture  1-Left Hip Fracture; Patient with multiple risk factors. He is at high risk for surgery. Cardiology consulted to help with medical care and for clearance. Ortho has been consulted. Family to speak with cardiology and ortho to make decision regarding preceding to surgery. IV dilaudid PRN for pain controlled.   2-Combine systolic and diastolic HF; patient requiring oxygen supplementation. He presents with hypotension. I will hold lisinopril and spironolactone. Cardiology consulted, to help with medical management. Will defer start lasix to cardio due to hypotension.   3-Hypotension;  improved with IV bolus. Will hold lisinopril, spironolactone. Hb at 10. No leukocytosis, no fever. No evidence of infection. Could be related to cardiogenic shock vs medications.   4-Hyperkalemia; Received calcium gluconate, sodium bicarb, IV fluids. Repeat B,met later today. Hold spironolactone and lisinopril.   5-Hyponatremia; chronic, in setting HF. Na level 124 to 127.  6-Acute Respiratory failure; oxygen supplementation. NSL fluids. Chest X ray;  Interstitial findings likely reflecting pulmonary edema. Bilateral pleural effusions right greater than left. Likely fluid along the minor fissure. Likely bibasilar atelectasis. Lasix per cardio in setting of hypotension.  7-Diabetes; hold glipizide. SSI.     Code Status: DNR.  Family Communication: Care discussed with Daughter and patient ex wife.  Disposition Plan: expect 3 to 4 days inpatient.   Time spent: 75 minutes.   ReNiel Hummer [facility] Pager [phone]

## 2013-12-05 ENCOUNTER — Inpatient Hospital Stay (HOSPITAL_COMMUNITY): Payer: Medicare Other

## 2013-12-05 DIAGNOSIS — J9600 Acute respiratory failure, unspecified whether with hypoxia or hypercapnia: Secondary | ICD-10-CM | POA: Diagnosis not present

## 2013-12-05 DIAGNOSIS — R911 Solitary pulmonary nodule: Secondary | ICD-10-CM

## 2013-12-05 LAB — GLUCOSE, CAPILLARY
GLUCOSE-CAPILLARY: 140 mg/dL — AB (ref 70–99)
GLUCOSE-CAPILLARY: 167 mg/dL — AB (ref 70–99)
GLUCOSE-CAPILLARY: 38 mg/dL — AB (ref 70–99)
GLUCOSE-CAPILLARY: 51 mg/dL — AB (ref 70–99)
GLUCOSE-CAPILLARY: 71 mg/dL (ref 70–99)
GLUCOSE-CAPILLARY: 73 mg/dL (ref 70–99)
Glucose-Capillary: 122 mg/dL — ABNORMAL HIGH (ref 70–99)
Glucose-Capillary: 134 mg/dL — ABNORMAL HIGH (ref 70–99)

## 2013-12-05 LAB — CBC
HCT: 24.5 % — ABNORMAL LOW (ref 39.0–52.0)
HEMOGLOBIN: 8.9 g/dL — AB (ref 13.0–17.0)
MCH: 31 pg (ref 26.0–34.0)
MCHC: 36.3 g/dL — AB (ref 30.0–36.0)
MCV: 85.4 fL (ref 78.0–100.0)
Platelets: 172 10*3/uL (ref 150–400)
RBC: 2.87 MIL/uL — ABNORMAL LOW (ref 4.22–5.81)
RDW: 12.6 % (ref 11.5–15.5)
WBC: 6.4 10*3/uL (ref 4.0–10.5)

## 2013-12-05 LAB — BASIC METABOLIC PANEL
BUN: 26 mg/dL — AB (ref 6–23)
CALCIUM: 8 mg/dL — AB (ref 8.4–10.5)
CO2: 24 mEq/L (ref 19–32)
Chloride: 90 mEq/L — ABNORMAL LOW (ref 96–112)
Creatinine, Ser: 0.77 mg/dL (ref 0.50–1.35)
GFR calc Af Amer: >90 mL/min (ref >90)
GFR, EST NON AFRICAN AMERICAN: 90 mL/min — AB (ref >90)
GLUCOSE: 150 mg/dL — AB (ref 70–99)
Potassium: 4.7 mEq/L (ref 3.7–5.3)
Sodium: 127 mEq/L — ABNORMAL LOW (ref 137–147)

## 2013-12-05 LAB — POCT I-STAT 3, ART BLOOD GAS (G3+)
Bicarbonate: 24.8 mEq/L — ABNORMAL HIGH (ref 20.0–24.0)
O2 Saturation: 100 %
PCO2 ART: 39.3 mmHg (ref 35.0–45.0)
PO2 ART: 303 mmHg — AB (ref 80.0–100.0)
TCO2: 26 mmol/L (ref 0–100)
pH, Arterial: 7.407 (ref 7.350–7.450)

## 2013-12-05 MED ORDER — VITAL AF 1.2 CAL PO LIQD
1000.0000 mL | ORAL | Status: DC
Start: 2013-12-05 — End: 2013-12-07
  Administered 2013-12-05: 1000 mL
  Filled 2013-12-05 (×4): qty 1000

## 2013-12-05 MED ORDER — DOPAMINE-DEXTROSE 3.2-5 MG/ML-% IV SOLN
INTRAVENOUS | Status: AC
  Administered 2013-12-05: 5 ug/kg/min via INTRAVENOUS
  Filled 2013-12-05: qty 250

## 2013-12-05 MED ORDER — SODIUM CHLORIDE 0.9 % IV BOLUS (SEPSIS)
500.0000 mL | Freq: Once | INTRAVENOUS | Status: AC
  Administered 2013-12-05: 500 mL via INTRAVENOUS

## 2013-12-05 MED ORDER — DEXTROSE 10 % IV SOLN
INTRAVENOUS | Status: DC
  Administered 2013-12-05: 16:00:00 via INTRAVENOUS
  Administered 2013-12-06: 500 mL via INTRAVENOUS

## 2013-12-05 MED ORDER — PANTOPRAZOLE SODIUM 40 MG PO PACK
40.0000 mg | PACK | Freq: Every day | ORAL | Status: DC
  Administered 2013-12-05 – 2013-12-10 (×4): 40 mg
  Filled 2013-12-05 (×8): qty 20

## 2013-12-05 MED ORDER — INSULIN ASPART 100 UNIT/ML ~~LOC~~ SOLN
2.0000 [IU] | SUBCUTANEOUS | Status: DC
  Administered 2013-12-05: 2 [IU] via SUBCUTANEOUS
  Administered 2013-12-05: 4 [IU] via SUBCUTANEOUS

## 2013-12-05 MED ORDER — DEXTROSE 50 % IV SOLN
25.0000 g | Freq: Once | INTRAVENOUS | Status: AC
  Administered 2013-12-05: 25 g via INTRAVENOUS

## 2013-12-05 MED ORDER — DEXTROSE 50 % IV SOLN
INTRAVENOUS | Status: AC
  Administered 2013-12-05: 25 g via INTRAVENOUS
  Filled 2013-12-05: qty 50

## 2013-12-05 MED ORDER — FENTANYL CITRATE 0.05 MG/ML IJ SOLN
25.0000 ug | INTRAMUSCULAR | Status: DC | PRN
  Administered 2013-12-05 (×3): 100 ug via INTRAVENOUS
  Administered 2013-12-05: 50 ug via INTRAVENOUS
  Administered 2013-12-05 (×3): 100 ug via INTRAVENOUS
  Administered 2013-12-05: 50 ug via INTRAVENOUS
  Administered 2013-12-05 – 2013-12-06 (×2): 100 ug via INTRAVENOUS
  Administered 2013-12-06: 50 ug via INTRAVENOUS
  Administered 2013-12-06 – 2013-12-07 (×4): 100 ug via INTRAVENOUS
  Filled 2013-12-05 (×14): qty 2

## 2013-12-05 MED ORDER — SODIUM CHLORIDE 0.9 % IV SOLN
INTRAVENOUS | Status: DC
  Administered 2013-12-05: 500 mL via INTRAVENOUS
  Administered 2013-12-07: 09:00:00 via INTRAVENOUS

## 2013-12-05 MED ORDER — DOPAMINE-DEXTROSE 3.2-5 MG/ML-% IV SOLN
2.0000 ug/kg/min | INTRAVENOUS | Status: DC
  Administered 2013-12-05: 5 ug/kg/min via INTRAVENOUS

## 2013-12-05 MED ORDER — FUROSEMIDE 10 MG/ML IJ SOLN
40.0000 mg | Freq: Four times a day (QID) | INTRAMUSCULAR | Status: DC
  Administered 2013-12-05: 40 mg via INTRAVENOUS
  Filled 2013-12-05: qty 4

## 2013-12-05 MED ORDER — DEXMEDETOMIDINE HCL IN NACL 200 MCG/50ML IV SOLN
0.4000 ug/kg/h | INTRAVENOUS | Status: DC
  Administered 2013-12-05: 0.6 ug/kg/h via INTRAVENOUS
  Administered 2013-12-05: 0.4 ug/kg/h via INTRAVENOUS
  Administered 2013-12-05: 0.6 ug/kg/h via INTRAVENOUS
  Administered 2013-12-05: 0.4 ug/kg/h via INTRAVENOUS
  Administered 2013-12-06: 0.8 ug/kg/h via INTRAVENOUS
  Administered 2013-12-06: 0.6 ug/kg/h via INTRAVENOUS
  Filled 2013-12-05 (×7): qty 50

## 2013-12-05 NOTE — Care Management Note (Signed)
    Page 1 of 1   12/05/2013     8:44:14 AM   CARE MANAGEMENT NOTE 12/05/2013  Patient:  [name] [name],[name] [name]   Account Number:  [id number]  Date Initiated:  12/05/2013  Documentation initiated by:  Junius Creamer  Subjective/Objective Assessment:   adm w hip fx     Action/Plan:   from nsg facility   Anticipated DC Date:     Anticipated DC Plan:  SKILLED NURSING FACILITY  In-house referral  Clinical Social Worker      DC Planning Services  CM consult      Choice offered to / List presented to:             Status of service:   Medicare Important Message given?   (If response is "NO", the following Medicare IM given date fields will be blank) Date Medicare IM given:   Date Additional Medicare IM given:    Discharge Disposition:    Per UR Regulation:  Reviewed for med. necessity/level of care/duration of stay  If discussed at Long Length of Stay Meetings, dates discussed:    Comments:

## 2013-12-05 NOTE — Progress Notes (Signed)
INITIAL NUTRITION ASSESSMENT  DOCUMENTATION CODES Per approved criteria  -Severe malnutrition in the context of chronic illness   INTERVENTION: 1.  Enteral nutrition; initiate Vital 1.2 @ 20 mL/hr continuous.  Advance by 10 mL q 4 hrs to 50 ml/hr goal to provide 1440 kcal, 99g protein, 944 mL free water.   NUTRITION DIAGNOSIS: Inadequate oral intake related to inability to eat as evidenced by NPO, intubated.   Monitor:  1.  Enteral nutrition; initiation with tolerance.  Pt to meet >/=90% estimated needs with nutrition support.  2.  Wt/wt change; monitor trends  Reason for Assessment: vent, consult  73 y.o. male  Admitting Dx: Hip fracture, left  ASSESSMENT: Pt admitted with hip fx; s/p repair. Pt remains intubated post-op.  Requiring pressors.   Patient is currently intubated on ventilator support MV: 9.0 L/min Temp (24hrs), Avg:98.4 F (36.9 C), Min:97.7 F (36.5 C), Max:98.9 F (37.2 C)  Propofol: none  Family at bedside reports is sedentary at baseline.  He has recently declined medically after dx with diabetes and CHF in July of last year.  Since that time he has progressed from independent living to assisted living, and had just transitioned to SNF the day prior to fall.  Family reports his usual weight as 114 lbs.   Nutrition Focused Physical Exam: Subcutaneous Fat:  Orbital Region: mild wasting Upper Arm Region: moderate wasting Thoracic and Lumbar Region: moderate-severe wasting  Muscle:  Temple Region: severe wasting Clavicle Bone Region: moderate wasting Clavicle and Acromion Bone Region: moderate-severe wasting Scapular Bone Region: severe wasting Dorsal Hand: moderate wasting Patellar Region: not assessed Anterior Thigh Region: not assessed Posterior Calf Region: not assessed  Edema: present  Height: Ht Readings from Last 1 Encounters:  12/04/13 5\' 6"  (1.676 m)    Weight: Wt Readings from Last 1 Encounters:  12/04/13 140 lb 3.4 oz (63.6 kg)     Ideal Body Weight: 64.5 kg  % Ideal Body Weight: 98%  Wt Readings from Last 10 Encounters:  12/04/13 140 lb 3.4 oz (63.6 kg)  12/04/13 140 lb 3.4 oz (63.6 kg)  12/03/13 126 lb 14.4 oz (57.561 kg)  03/31/13 114 lb (51.71 kg)    Usual Body Weight: 114-115 lbs per family  % Usual Body Weight: 81%  BMI:  Body mass index is 22.64 kg/(m^2).  Estimated Nutritional Needs: Kcal: 1501 Protein: 78-93g Fluid: >1.5 L/day  Skin: intact  Diet Order:  NPO  EDUCATION NEEDS: -Education not appropriate at this time   Intake/Output Summary (Last 24 hours) at 12/05/13 1412 Last data filed at 12/05/13 0800  Gross per 24 hour  Intake 1798.45 ml  Output   2155 ml  Net -356.55 ml    Last BM: 4/4   Labs:   Recent Labs Lab 11/28/13 2235 11/30/13 0411  12/04/13 1217 12/04/13 1947 12/04/13 2246 12/05/13 0410  NA 131* 130*  < > 124* 125* 124* 127*  K 4.2 4.8  < > 5.9* 5.7* 4.8 4.7  CL 91* 90*  < > 89* 89*  --  90*  CO2 27 27  < > 25 23  --  24  BUN 12 13  < > 24* 27*  --  26*  CREATININE 0.70 0.68  < > 0.82 0.80  --  0.77  CALCIUM 9.3 9.2  < > 7.9* 8.5  --  8.0*  MG  --  1.7  --   --   --   --   --   PHOS  --  3.5  --   --   --   --   --   GLUCOSE 81 72  < > 145* 122*  --  150*  < > = values in this interval not displayed.  CBG (last 3)   Recent Labs  12/05/13 0815 12/05/13 1145 12/05/13 1148  GLUCAP 71 33* 38*    Scheduled Meds: . antiseptic oral rinse  15 mL Mouth Rinse QID  . chlorhexidine  15 mL Mouth Rinse BID  . docusate sodium  100 mg Oral BID  . enoxaparin (LOVENOX) injection  30 mg Subcutaneous Q24H  . ferrous sulfate  325 mg Oral TID PC  . insulin aspart  2-6 Units Subcutaneous 6 times per day  . lipase/protease/amylase  1 capsule Oral TID WC  . multivitamin with minerals  1 tablet Oral Daily  . pantoprazole sodium  40 mg Per Tube Q1200  . pregabalin  50 mg Oral TID  . simvastatin  20 mg Oral q1800    Continuous Infusions: . sodium chloride 20  mL/hr at 12/05/13 0800  . dexmedetomidine 0.6 mcg/kg/hr (12/05/13 1223)  . DOPamine 1.5 mcg/kg/min (12/05/13 1223)    Past Medical History  Diagnosis Date  . Pancreatic disorder   . Cardiomyopathy, dilated   . Hypertension 11/29/2013  . Protein-calorie malnutrition, severe 12/01/2013  . High cholesterol 11/2013  . Chronic combined systolic and diastolic CHF (congestive heart failure):  Echo 11/29/13:  EF 15%, Grade two diastolic disfunction. 11/28/2013  . Type II diabetes mellitus   . Lung nodule     "incidental, >3 mm and < 8mm" (12/04/2013)  . Pleural effusion   . Femoral neck fracture 12/04/2013    "left; fell at nursing home today"     Past Surgical History  Procedure Laterality Date  . Tonsillectomy      Loyce DysKacie Lindora Alviar, MS RD LDN Clinical Inpatient Dietitian Pager: [phone] Weekend/After hours pager: [phone]

## 2013-12-05 NOTE — Progress Notes (Signed)
Orthopedic Tech Progress Note Patient Details:  [name] [name] [birth year] [id number]  Patient ID: [name] [name], male   DOB: 1941-05-13, 73 y.o.   MRN: [id number] Pt unable to use ohf ; rn notified  [name] [name] 12/05/2013, 10:32 AM

## 2013-12-05 NOTE — Consult Note (Addendum)
PULMONARY / CRITICAL CARE MEDICINE   Name: [name] EmoryClyde Kussman MRN: [id number] DOB: [birth year]    ADMISSION DATE:  12/04/2013 CONSULTATION DATE:  12/04/2013  [facility] MD :  [facility] PRIMARY SERVICE: [facility]  CHIEF COMPLAINT:  Respiratory failure.  BRIEF PATIENT DESCRIPTION: 73 year old male with extensive medical history was in rehab facility, fell and broke his hip.  Taken to the OR today for fixation.  Post op remained intubated.  PCCM asked to manage post op and liberate fro vent.  SIGNIFICANT EVENTS / STUDIES:  4/7 intubated post op.  LINES / TUBES: PIV ETT 4/7>>> R radial a-line 4/7>>>  CULTURES: None  ANTIBIOTICS: Cefazolin post op 4/7>>>  SUBJECTIVE: BP marginal after precedex and diureses.  VITAL SIGNS: Temp:  [97.7 F (36.5 C)-98.9 F (37.2 C)] 97.8 F (36.6 C) (04/08 0400) Pulse Rate:  [30-87] 58 (04/08 0500) Resp:  [10-23] 17 (04/08 0500) BP: (70-160)/(30-103) 97/52 mmHg (04/08 0500) SpO2:  [90 %-100 %] 100 % (04/08 0500) FiO2 (%):  [40 %-100 %] 40 % (04/08 0447) Weight:  [140 lb 3.4 oz (63.6 kg)-300 lb 0.7 oz (136.1 kg)] 140 lb 3.4 oz (63.6 kg) (04/07 2300) HEMODYNAMICS:   VENTILATOR SETTINGS: Vent Mode:  [-] PRVC FiO2 (%):  [40 %-100 %] 40 % Set Rate:  [14 bmp-16 bmp] 16 bmp Vt Set:  [520 mL] 520 mL PEEP:  [5 cmH20] 5 cmH20 Plateau Pressure:  [16 cmH20-20 cmH20] 20 cmH20 INTAKE / OUTPUT: Intake/Output     04/07 0701 - 04/08 0700   I.V. (mL/kg) 618.2 (9.7)   IV Piggyback 1050   Total Intake(mL/kg) 1668.2 (26.2)   Urine (mL/kg/hr) 2125   Blood 30   Total Output 2155   Net -486.9        PHYSICAL EXAMINATION: General:  Chronically ill appearing male, sedated, intubated. Neuro:  Sedated, intubated, moves ext to command. HEENT:  [place]/AT, PERRL, EOM-I and MMM. Cardiovascular:  RRR, Nl S1/S2, -M/R/G. Lungs:  Coarse BS diffusely. Abdomen:  Soft, NT, ND and +BS. Musculoskeletal:  -edema Skin:  Intact, surgical site clean.  LABS:  CBC  Recent Labs Lab  12/01/13 0328 12/04/13 1217 12/04/13 2246 12/05/13 0410  WBC 4.7 5.3  --  6.4  HGB 11.4* 10.2* 9.2* 8.9*  HCT 31.7* 28.6* 27.0* 24.5*  PLT 230 200  --  172   Coag's No results found for this basename: APTT, INR,  in the last 168 hours  BMET  Recent Labs Lab 12/04/13 1217 12/04/13 1947 12/04/13 2246 12/05/13 0410  NA 124* 125* 124* 127*  K 5.9* 5.7* 4.8 4.7  CL 89* 89*  --  90*  CO2 25 23  --  24  BUN 24* 27*  --  26*  CREATININE 0.82 0.80  --  0.77  GLUCOSE 145* 122*  --  150*   Electrolytes  Recent Labs Lab 11/30/13 0411  12/04/13 1217 12/04/13 1947 12/05/13 0410  CALCIUM 9.2  < > 7.9* 8.5 8.0*  MG 1.7  --   --   --   --   PHOS 3.5  --   --   --   --   < > = values in this interval not displayed.  Sepsis Markers No results found for this basename: LATICACIDVEN, PROCALCITON, O2SATVEN,  in the last 168 hours  ABG  Recent Labs Lab 12/04/13 2246 12/04/13 2357  PHART 7.423 7.407  PCO2ART 31.5* 39.3  PO2ART 62.0* 303.0*   Liver Enzymes No results found for this basename: AST, ALT, ALKPHOS,  BILITOT, ALBUMIN,  in the last 168 hours Cardiac Enzymes  Recent Labs Lab 11/28/13 1920 12/01/13 0328 12/04/13 1449  TROPONINI <0.30  --  <0.30  PROBNP  --  9675.0* 5148.0*   Glucose  Recent Labs Lab 12/02/13 2145 12/03/13 0614 12/03/13 1118 12/04/13 1053 12/05/13 0004 12/05/13 0350  GLUCAP 70 102* 195* 162* 134* 167*   Imaging Dg Hip Complete Left  12/04/2013   CLINICAL DATA:  Fall  EXAM: LEFT HIP - COMPLETE 2+ VIEW  COMPARISON:  None.  FINDINGS: Fractures of the left femoral neck with some impaction and angulation. Mild displacement. Hip joint appears normal on the left. Chronic healed fracture left ischial healed tuberosity. .  IMPRESSION: Left femoral neck fracture.   Electronically Signed   By: Marlan Palau M.D.   On: 12/04/2013 12:14   Dg Pelvis Portable  12/05/2013   CLINICAL DATA:  Postoperative radiographs for left-sided hemiarthroplasty.   EXAM: PORTABLE PELVIS 1-2 VIEWS  COMPARISON:  Left hip radiographs performed earlier today at 11:56 a.m.  FINDINGS: The patient is status post left hip hemiarthroplasty. The prosthesis demonstrates normal alignment, without evidence of loosening. No new fractures are seen. Overlying soft tissue air is noted. The right hip is grossly unremarkable in appearance. The visualized portions of the sacroiliac joints are grossly unremarkable. The visualized bowel gas pattern is within normal limits.  IMPRESSION: Status post left hip hemiarthroplasty; the prosthesis demonstrates normal alignment, without evidence of loosening. No new fracture seen.   Electronically Signed   By: Roanna Raider M.D.   On: 12/05/2013 00:53   Dg Chest Portable 1 View  12/04/2013   CLINICAL DATA:  FALL HIP INJURY  EXAM: PORTABLE CHEST - 1 VIEW  COMPARISON:  CT CHEST W/CM dated 11/29/2013  FINDINGS: Cardiac silhouette is enlarged. A right pleural effusion is appreciated tracking along the lateral chest wall. A smaller left effusion is identified. Areas of increased density projects within the lung bases. There is prominence of the interstitial markings. A wedge-shaped area of increased density extends from the right hilar region and appears to track along the minor minor fissure likely representing fluid. Degenerative changes are appreciated within the shoulders.  IMPRESSION: Interstitial findings likely reflecting pulmonary edema.  Bilateral pleural effusions right greater than left  Likely fluid along the minor fissure  Likely bibasilar atelectasis.   Electronically Signed   By: Salome Holmes M.D.   On: 12/04/2013 13:53   CXR: Pulmonary edema pre op.  ASSESSMENT / PLAN:  PULMONARY A: Intubated post op.  Pulmonary edema.  Bilateral pleural effusion.  Pulmonary nodule. P:   - Begin PS trials today, hopefully will be able to extubate later today, if not then tomorrow. - ABG noted, titrate O2 down for sat of 88-92%. - Hold further  diureses for now given BP. - Pleural effusion chronic, no changes for now. - Will not address pulmonary nodule during acute illness.  CARDIOVASCULAR A: EF of 15% and grade 2 diastolic dysfunction. P:  - Hold further diureses for now, if BP improves later then can add an additional dose of lasix. - KVO IVF. - Continue his CHF medication but per tube. - If BP drops further then will hold coreg  RENAL A:  Hyperkalemia - improved with lasix. P:   - Hold further lasix. - BMET in AM. - Replace electrolytes as indicated.  GASTROINTESTINAL A:  Appears malnutritioned. P:   - Consult nutrition for TF, doubt will be extubatable today. - Protonix as ordered.  HEMATOLOGIC A:  No active issues. P:  - SCD's for DVT prophylaxis. - Lovenox DVT prophylaxis as well - high risk given surgery and immobility.  INFECTIOUS A:  No active infection. P:   - Cefazolin post op per ortho.  ENDOCRINE A:  Diabetes by history.   P:   - CBG's - ISS.  NEUROLOGIC A:  Sedated, paralyzed and intubated for now. P:   - D/Ced dilaudid. - Fentanyl for pain control. - Precedex with the hope that we can wean with that tomorrow as behavior will be unpredictable given relayed history of dementia.  TODAY'S SUMMARY: Hold lasix, begin PS trials, no extubation until able to move better volumes, consult nutrition for TF, anticipate extubation in AM.  I have personally obtained a history, examined the patient, evaluated laboratory and imaging results, formulated the assessment and plan and placed orders.  CRITICAL CARE: The patient is critically ill with multiple organ systems failure and requires high complexity decision making for assessment and support, frequent evaluation and titration of therapies, application of advanced monitoring technologies and extensive interpretation of multiple databases. Critical Care Time devoted to patient care services described in this note is 35 minutes.   Alyson Reedy,  M.D. [facility] Pulmonary/Critical Care Medicine. Pager: [phone]. After hours pager: [phone].

## 2013-12-05 NOTE — Progress Notes (Signed)
Spoke with next of kin, DPOA, the patient was DNR prior to this.  Now is becoming more hypotensive and bradycardic.  Unable to wean due to IVF, pulmonary edema and hypoxemia now.  After discussion with family, relayed that patient is stable for now.  But after we talked, they do not wish for central access, pressors more than ordered, prolonged mechanical support, no CPR and no cardioversion.  Will make patient LCB with no CPR/cardioversion/dopamine >5 mcg and if unable to extubate in the next 48 hours then will terminally extubate.  Alyson Reedy, M.D. [facility] Pulmonary/Critical Care Medicine. Pager: [phone]. After hours pager: [phone].

## 2013-12-05 NOTE — Progress Notes (Signed)
PT Cancellation Note  Patient Details Name: [name] [name] MRN: [id number] DOB: [birth year]   Cancelled Treatment:    Reason Eval Not Completed: Patient not medically ready. Pt intubated, sedated, and hypotensive.    Scherrie November Aayat Hajjar 12/05/2013, 8:05 AM Pager [phone]

## 2013-12-05 NOTE — Progress Notes (Signed)
Discussed patients current medical status.  He has hemodynamically stabilized but remains on mechanical ventilation.  Dopamine weaned to & precedex infusing.  Informed family that this was an elective intubation for a surgical procedure and since he has stabilized, would be premature to withdrawal care at this point.    Plan: -will not up titrate dopamine -if pt declines, would institute comfort measures / withdrawal of care -will reassess patient and continue to have ongoing discussions with family regarding clinical status and progress -wean fiO2 for sats > 93% -no further escalation of care   Canary Brim, NP-C [facility] Pgr: [phone] or [phone]

## 2013-12-05 NOTE — Progress Notes (Signed)
Patient Name: [name] EmoryClyde Loeper Date of Encounter: 12/05/2013     Principal Problem:   Hip fracture, left Active Problems:   Chronic combined systolic and diastolic CHF (congestive heart failure):  Echo 11/29/13:  EF 15%, Grade two diastolic disfunction.   Type II or unspecified type diabetes mellitus without mention of complication, not stated as uncontrolled   Incidental lung nodule, > 3mm and < 8mm   Hypotension   Preoperative cardiovascular examination   Acute respiratory failure    SUBJECTIVE  Patient was hypotensive during the night despite IV fluid bolus.  However her blood pressure has improved nicely on low-dose dopamine.  He remains sedated on the vent.  Rhythm is normal sinus rhythm.  He has a history of significant left ventricular dysfunction with ejection fraction of 15% by recent echocardiogram.  CURRENT MEDS . antiseptic oral rinse  15 mL Mouth Rinse QID  . chlorhexidine  15 mL Mouth Rinse BID  . docusate sodium  100 mg Oral BID  . enoxaparin (LOVENOX) injection  30 mg Subcutaneous Q24H  . ferrous sulfate  325 mg Oral TID PC  . insulin aspart  2-6 Units Subcutaneous 6 times per day  . lipase/protease/amylase  1 capsule Oral TID WC  . multivitamin with minerals  1 tablet Oral Daily  . pantoprazole sodium  40 mg Per Tube Q1200  . pregabalin  50 mg Oral TID  . simvastatin  20 mg Oral q1800    OBJECTIVE  Filed Vitals:   12/05/13 0805 12/05/13 0810 12/05/13 0815 12/05/13 0830  BP:    112/84  Pulse: 71 83 88 92  Temp:      TempSrc:      Resp: 14 18 20 17   Height:      Weight:      SpO2: 88% 95% 99% 100%    Intake/Output Summary (Last 24 hours) at 12/05/13 1018 Last data filed at 12/05/13 0700  Gross per 24 hour  Intake 1770.95 ml  Output   2155 ml  Net -384.05 ml   Filed Weights   12/04/13 1800 12/04/13 2300  Weight: 300 lb 0.7 oz (136.1 kg) 140 lb 3.4 oz (63.6 kg)    PHYSICAL EXAM  General: Elderly frail gentleman on the ventilator,  sedated. Neuro: Sedated. Psych: Not testable. HEENT:  Normal  Neck: Supple without bruits or JVD. Lungs:  Resp regular and unlabored, CTA. Heart: RRR no s3, s4, or murmurs. Abdomen: Soft, non-tender, non-distended, BS + x 4.  Extremities: No edema  Accessory Clinical Findings  CBC  Recent Labs  12/04/13 1217 12/04/13 2246 12/05/13 0410  WBC 5.3  --  6.4  NEUTROABS 3.8  --   --   HGB 10.2* 9.2* 8.9*  HCT 28.6* 27.0* 24.5*  MCV 87.2  --  85.4  PLT 200  --  172   Basic Metabolic Panel  Recent Labs  12/04/13 1947 12/04/13 2246 12/05/13 0410  NA 125* 124* 127*  K 5.7* 4.8 4.7  CL 89*  --  90*  CO2 23  --  24  GLUCOSE 122*  --  150*  BUN 27*  --  26*  CREATININE 0.80  --  0.77  CALCIUM 8.5  --  8.0*   Liver Function Tests No results found for this basename: AST, ALT, ALKPHOS, BILITOT, PROT, ALBUMIN,  in the last 72 hours No results found for this basename: LIPASE, AMYLASE,  in the last 72 hours Cardiac Enzymes  Recent Labs  12/04/13 1449  TROPONINI <0.30  BNP No components found with this basename: POCBNP,  D-Dimer No results found for this basename: DDIMER,  in the last 72 hours Hemoglobin A1C No results found for this basename: HGBA1C,  in the last 72 hours Fasting Lipid Panel No results found for this basename: CHOL, HDL, LDLCALC, TRIG, CHOLHDL, LDLDIRECT,  in the last 72 hours Thyroid Function Tests  Recent Labs  12/02/13 1400  TSH 4.410    TELE  Normal sinus rhythm  ECG preoperative on 12/04/13 Sinus rhythm Right bundle branch block Abnormal T, consider ischemia, lateral leads.  Radiology/Studies  Dg Chest 2 View  11/28/2013   CLINICAL DATA:  Fatigue and muscle weakness.  EXAM: CHEST  2 VIEW  COMPARISON:  None.  FINDINGS: Lung volumes are low. There is bibasilar collapse/ consolidation with small bilateral pleural effusions. No evidence for overt pulmonary edema at this time. The cardio pericardial silhouette is enlarged. Bones are  diffusely demineralized. Telemetry leads overlie the chest.  IMPRESSION: Bilateral lower lobe collapse/consolidation with small to moderate bilateral pleural effusions.   Electronically Signed   By: Kennith Center M.D.   On: 11/28/2013 20:24   Dg Hip Complete Left  12/04/2013   CLINICAL DATA:  Fall  EXAM: LEFT HIP - COMPLETE 2+ VIEW  COMPARISON:  None.  FINDINGS: Fractures of the left femoral neck with some impaction and angulation. Mild displacement. Hip joint appears normal on the left. Chronic healed fracture left ischial healed tuberosity. .  IMPRESSION: Left femoral neck fracture.   Electronically Signed   By: Marlan Palau M.D.   On: 12/04/2013 12:14   Ct Chest W Contrast  11/29/2013   CLINICAL DATA:  73 year old male with shortness of breath and cough. Recent weight gain and lower extremity swelling. Pleural effusions. Initial encounter.  EXAM: CT CHEST WITH CONTRAST  TECHNIQUE: Multidetector CT imaging of the chest was performed during intravenous contrast administration.  CONTRAST:  43mL OMNIPAQUE IOHEXOL 300 MG/ML  SOLN  COMPARISON:  Chest radiographs 11/28/2013.  FINDINGS: Moderate to large bilateral layering pleural effusions, the right is larger. Simple fluid densitometry suggesting transudate fluid. No pericardial effusion. Cardiomegaly. Widespread atherosclerotic plaque in the aorta. Coronary artery involvement.  Compressive atelectasis in both lungs. There is also a 7 mm right middle lobe lung nodule on series 3, image 38. Major airways are patent.  No acute or suspicious osseous lesion. Mild chronic left lateral tenth rib fracture. Mild scoliosis.  No axillary or mediastinal lymphadenopathy.  Visualized upper abdomen remarkable for confluent dystrophic calcifications throughout the visible pancreas.  IMPRESSION: 1. Moderate to large right greater than left layering pleural effusions with compressive atelectasis. 2. Seven mm right middle lobe lung nodule (series 3, image 38). If the patient is at  high risk for bronchogenic carcinoma, follow-up chest CT at 3-6 months is recommended. If the patient is at low risk for bronchogenic carcinoma, follow-up chest CT at 6-12 months is recommended. This recommendation follows the consensus statement: Guidelines for Management of Small Pulmonary Nodules Detected on CT Scans: A Statement from the Fleischner Society as published in Radiology 2005; 237:395-400. 3. Chronic calcific pancreatitis.   Electronically Signed   By: Augusto Gamble M.D.   On: 11/29/2013 13:54   Dg Pelvis Portable  12/05/2013   CLINICAL DATA:  Postoperative radiographs for left-sided hemiarthroplasty.  EXAM: PORTABLE PELVIS 1-2 VIEWS  COMPARISON:  Left hip radiographs performed earlier today at 11:56 a.m.  FINDINGS: The patient is status post left hip hemiarthroplasty. The prosthesis demonstrates normal alignment, without evidence of  loosening. No new fractures are seen. Overlying soft tissue air is noted. The right hip is grossly unremarkable in appearance. The visualized portions of the sacroiliac joints are grossly unremarkable. The visualized bowel gas pattern is within normal limits.  IMPRESSION: Status post left hip hemiarthroplasty; the prosthesis demonstrates normal alignment, without evidence of loosening. No new fracture seen.   Electronically Signed   By: Roanna Raider M.D.   On: 12/05/2013 00:53   Dg Chest Port 1 View  12/05/2013   CLINICAL DATA:  Check endotracheal tube position.  EXAM: PORTABLE CHEST - 1 VIEW  COMPARISON:  DG CHEST 1V PORT dated 12/04/2013; CT CHEST W/CM dated 11/29/2013; DG CHEST 2 VIEW dated 11/28/2013  FINDINGS: Endotracheal tube terminates approximately 6.2 cm above the carina. Nasogastric tube is followed into the stomach with the tip projecting beyond the inferior margin of the image. Heart size stable. Thoracic aorta is calcified. Bibasilar airspace consolidation with bilateral pleural effusions, stable. There may be mild interstitial prominence as well.  IMPRESSION:  1. Bilateral effusions and bibasilar airspace consolidation. Pneumonia not excluded. 2. Possible mild interstitial prominence suggesting edema.   Electronically Signed   By: Leanna Battles M.D.   On: 12/05/2013 07:20   Dg Chest Portable 1 View  12/04/2013   CLINICAL DATA:  FALL HIP INJURY  EXAM: PORTABLE CHEST - 1 VIEW  COMPARISON:  CT CHEST W/CM dated 11/29/2013  FINDINGS: Cardiac silhouette is enlarged. A right pleural effusion is appreciated tracking along the lateral chest wall. A smaller left effusion is identified. Areas of increased density projects within the lung bases. There is prominence of the interstitial markings. A wedge-shaped area of increased density extends from the right hilar region and appears to track along the minor minor fissure likely representing fluid. Degenerative changes are appreciated within the shoulders.  IMPRESSION: Interstitial findings likely reflecting pulmonary edema.  Bilateral pleural effusions right greater than left  Likely fluid along the minor fissure  Likely bibasilar atelectasis.   Electronically Signed   By: Salome Holmes M.D.   On: 12/04/2013 13:53    ASSESSMENT AND PLAN 1.  Severe left ventricular dysfunction with ejection fraction of 15% and grade 2 diastolic dysfunction.  Recent admission for CHF 2. postop left hip hemiarthroplasty 12/04/13 3. postoperative ventilator support 4. postoperative hypotension responding to low dose dopamine.  Plan: Per family wishes, no escalation of care.  Patient is now LCB. See Dr. Percival Spanish note.    Signed, Cassell Clement MD

## 2013-12-05 NOTE — Progress Notes (Signed)
eLink Physician-Brief Progress Note Patient Name: Subhash Upshur DOB: 06/17/1941 MRN: [id number]  Date of Service  12/05/2013   HPI/Events of Note   Hypotensive.  eICU Interventions  Will give 500 ml NS bolus, and hold lasix >> if BP doesn't improve might need pressors.   Intervention Category Intermediate Interventions: Other:  Lissy Deuser 12/05/2013, 4:59 AM

## 2013-12-05 NOTE — Progress Notes (Signed)
eLink Physician-Brief Progress Note Patient Name: [name] [name] DOB: 10/23/1940 MRN: [id number]  Date of Service  12/05/2013   HPI/Events of Note   Recurrent hypoglycemia, confirmed by art line. Has not received insulins. Has received d50. TF planned to start this pm  eICU Interventions  Will start d10 at 50cc/h , follow CBG. Anticipate that we will be able to change to d5 0.45NS once TF are running.    Intervention Category Intermediate Interventions: Other:  Leslye Peer 12/05/2013, 4:16 PM

## 2013-12-05 NOTE — Consult Note (Addendum)
PULMONARY / CRITICAL CARE MEDICINE   Name: [name] [name] MRN: [id number] DOB: March 22, 1941    ADMISSION DATE:  12/04/2013 CONSULTATION DATE:  12/04/2013  [facility]  CHIEF COMPLAINT:  Respiratory failure.  BRIEF PATIENT DESCRIPTION: 73 year old male with extensive medical history was in rehab facility, fell and broke his hip.  Taken to the OR today for fixation.  Post op remained intubated.  PCCM asked to manage post op and liberate fro vent.  SIGNIFICANT EVENTS / STUDIES:  4/7 intubated post op.  LINES / TUBES: PIV ETT 4/7>>> R radial a-line 4/7>>>  CULTURES: None  ANTIBIOTICS: Cefazolin post op 4/7>>>  PAST MEDICAL HISTORY :  Past Medical History  Diagnosis Date  . Pancreatic disorder   . Cardiomyopathy, dilated   . Hypertension 11/29/2013  . Protein-calorie malnutrition, severe 12/01/2013  . High cholesterol 11/2013  . Chronic combined systolic and diastolic CHF (congestive heart failure):  Echo 11/29/13:  EF 15%, Grade two diastolic disfunction. 11/28/2013  . Type II diabetes mellitus   . Lung nodule     "incidental, >3 mm and < 8mm" (12/04/2013)  . Pleural effusion   . Femoral neck fracture 12/04/2013    "left; fell at nursing home today"    Past Surgical History  Procedure Laterality Date  . Tonsillectomy     Prior to Admission medications   Medication Sig Start Date End Date Taking? Authorizing Provider  bisacodyl (DULCOLAX) 10 MG suppository Place 10 mg rectally daily as needed for moderate constipation.   Yes Historical Provider, MD  carvedilol (COREG) 3.125 MG tablet Take 1 tablet (3.125 mg total) by mouth 2 (two) times daily with a meal. 12/03/13  Yes Marinda ElkAbraham Feliz Ortiz, MD  Cholecalciferol (VITAMIN D-3) 1000 UNITS CAPS Take 1,000 Units by mouth daily.   Yes Historical Provider, MD  FLUoxetine (PROZAC) 20 MG capsule Take 20 mg by mouth daily.   Yes Historical Provider, MD  furosemide (LASIX) 20 MG tablet Take 1 tablet (20 mg total) by mouth  daily. 12/03/13  Yes Marinda ElkAbraham Feliz Ortiz, MD  glipiZIDE (GLUCOTROL) 10 MG tablet Take 0.5 tablets (5 mg total) by mouth 2 (two) times daily before a meal. 12/03/13  Yes Marinda ElkAbraham Feliz Ortiz, MD  lipase/protease/amylase (CREON-12/PANCREASE) 12000 UNITS CPEP Take 1 capsule by mouth 3 (three) times daily with meals.    Yes Historical Provider, MD  lisinopril (PRINIVIL,ZESTRIL) 5 MG tablet Take 1 tablet (5 mg total) by mouth daily. 12/03/13  Yes Marinda ElkAbraham Feliz Ortiz, MD  magnesium hydroxide (MILK OF MAGNESIA) 400 MG/5ML suspension Take 30 mLs by mouth daily as needed for mild constipation.   Yes Historical Provider, MD  Multiple Vitamins-Minerals (MULTIVITAMIN WITH MINERALS) tablet Take 1 tablet by mouth daily.   Yes Historical Provider, MD  nicotine (NICODERM CQ - DOSED IN MG/24 HOURS) 21 mg/24hr patch Place 21 mg onto the skin daily.   Yes Historical Provider, MD  pregabalin (LYRICA) 50 MG capsule Take 1 capsule (50 mg total) by mouth 3 (three) times daily. 12/03/13  Yes Tiffany L Reed, DO  simvastatin (ZOCOR) 20 MG tablet Take 1 tablet (20 mg total) by mouth daily at 6 PM. 12/03/13  Yes Marinda ElkAbraham Feliz Ortiz, MD  spironolactone (ALDACTONE) 12.5 mg TABS tablet Take 0.5 tablets (12.5 mg total) by mouth daily. 12/03/13  Yes Marinda ElkAbraham Feliz Ortiz, MD  enoxaparin (LOVENOX) 30 MG/0.3ML injection Inject 0.3 mLs (30 mg total) into the skin daily. 12/04/13   Genelle GatherMatthew Scott Babish, PA-C  HYDROcodone-acetaminophen (  NORCO/VICODIN) 5-325 MG per tablet Take 1-2 tablets by mouth every 6 (six) hours as needed for moderate pain. 12/04/13   Genelle Gather Babish, PA-C   No Known Allergies  FAMILY HISTORY:  Family History  Problem Relation Age of Onset  . Breast cancer Mother   . CAD Father   . Cancer - Other Sister     Laryngeal   SOCIAL HISTORY:  reports that he has been smoking Cigarettes.  He has a 57 pack-year smoking history. He has never used smokeless tobacco. He reports that he drinks alcohol. He reports that he does not use  illicit drugs.  REVIEW OF SYSTEMS:  Unattainable, patient is sedated and intubated.  SUBJECTIVE:   VITAL SIGNS: Temp:  [97.7 F (36.5 C)-98.9 F (37.2 C)] 97.7 F (36.5 C) (04/07 2315) Pulse Rate:  [30-87] 80 (04/07 2330) Resp:  [12-23] 14 (04/07 2330) BP: (70-160)/(30-103) 135/74 mmHg (04/07 2315) SpO2:  [90 %-100 %] 100 % (04/07 2330) FiO2 (%):  [100 %] 100 % (04/07 2315) Weight:  [140 lb 3.4 oz (63.6 kg)-300 lb 0.7 oz (136.1 kg)] 140 lb 3.4 oz (63.6 kg) (04/07 2300) HEMODYNAMICS:   VENTILATOR SETTINGS: Vent Mode:  [-] PRVC FiO2 (%):  [100 %] 100 % Set Rate:  [14 bmp] 14 bmp Vt Set:  [520 mL] 520 mL PEEP:  [5 cmH20] 5 cmH20 Plateau Pressure:  [16 cmH20] 16 cmH20 INTAKE / OUTPUT: Intake/Output     04/07 0701 - 04/08 0700   I.V. (mL/kg) 500 (7.9)   IV Piggyback 500   Total Intake(mL/kg) 1000 (15.7)   Urine (mL/kg/hr) 525   Blood 30   Total Output 555   Net +445         PHYSICAL EXAMINATION: General:  Chronically ill appearing male, sedated, intubated and paralyzed. Neuro:  Sedated, paralyzed, intubated, unable to perform. HEENT:  [place]/AT, pupils non-reactive, no EOM, paralyzed, ET tube ok. Cardiovascular:  RRR, Nl S1/S2, -M/R/G. Lungs:  Coarse BS diffusely.  Crackles bilaterally. Abdomen:  Soft, NT, ND and +BS. Musculoskeletal:  -edema Skin:  Intact, surgical site clean.  LABS:  CBC  Recent Labs Lab 11/30/13 0411 12/01/13 0328 12/04/13 1217 12/04/13 2246  WBC 5.5 4.7 5.3  --   HGB 11.6* 11.4* 10.2* 9.2*  HCT 32.5* 31.7* 28.6* 27.0*  PLT 258 230 200  --    Coag's No results found for this basename: APTT, INR,  in the last 168 hours BMET  Recent Labs Lab 12/03/13 1003 12/04/13 1217 12/04/13 1947 12/04/13 2246  NA 126* 124* 125* 124*  K 5.9* 5.9* 5.7* 4.8  CL 87* 89* 89*  --   CO2 28 25 23   --   BUN 22 24* 27*  --   CREATININE 0.65 0.82 0.80  --   GLUCOSE 100* 145* 122*  --    Electrolytes  Recent Labs Lab 11/30/13 0411  12/03/13 1003  12/04/13 1217 12/04/13 1947  CALCIUM 9.2  < > 8.5 7.9* 8.5  MG 1.7  --   --   --   --   PHOS 3.5  --   --   --   --   < > = values in this interval not displayed. Sepsis Markers No results found for this basename: LATICACIDVEN, PROCALCITON, O2SATVEN,  in the last 168 hours ABG  Recent Labs Lab 12/04/13 2246  PHART 7.423  PCO2ART 31.5*  PO2ART 62.0*   Liver Enzymes No results found for this basename: AST, ALT, ALKPHOS, BILITOT, ALBUMIN,  in  the last 168 hours Cardiac Enzymes  Recent Labs Lab 11/28/13 1920 12/01/13 0328 12/04/13 1449  TROPONINI <0.30  --  <0.30  PROBNP  --  9675.0* 5148.0*   Glucose  Recent Labs Lab 12/02/13 1154 12/02/13 1622 12/02/13 2145 12/03/13 0614 12/03/13 1118 12/04/13 1053  GLUCAP 193* 151* 70 102* 195* 162*    Imaging Dg Hip Complete Left  12/04/2013   CLINICAL DATA:  Fall  EXAM: LEFT HIP - COMPLETE 2+ VIEW  COMPARISON:  None.  FINDINGS: Fractures of the left femoral neck with some impaction and angulation. Mild displacement. Hip joint appears normal on the left. Chronic healed fracture left ischial healed tuberosity. .  IMPRESSION: Left femoral neck fracture.   Electronically Signed   By: Marlan Palau M.D.   On: 12/04/2013 12:14   Dg Chest Portable 1 View  12/04/2013   CLINICAL DATA:  FALL HIP INJURY  EXAM: PORTABLE CHEST - 1 VIEW  COMPARISON:  CT CHEST W/CM dated 11/29/2013  FINDINGS: Cardiac silhouette is enlarged. A right pleural effusion is appreciated tracking along the lateral chest wall. A smaller left effusion is identified. Areas of increased density projects within the lung bases. There is prominence of the interstitial markings. A wedge-shaped area of increased density extends from the right hilar region and appears to track along the minor minor fissure likely representing fluid. Degenerative changes are appreciated within the shoulders.  IMPRESSION: Interstitial findings likely reflecting pulmonary edema.  Bilateral pleural  effusions right greater than left  Likely fluid along the minor fissure  Likely bibasilar atelectasis.   Electronically Signed   By: Salome Holmes M.D.   On: 12/04/2013 13:53     CXR: Pulmonary edema pre op.  ASSESSMENT / PLAN:  PULMONARY A: Intubated post op.  Pulmonary edema.  Bilateral pleural effusion.  Pulmonary nodule. P:   - CXR now and in AM. - ABG now and in AM. - Diureses as below. - Pleural effusion chronic, no changes for now. - Will not address pulmonary nodule during acute illness. - Hopefully can diurese enough and extubate in AM.  CARDIOVASCULAR A: EF of 15% and grade 2 diastolic dysfunction. P:  - Diureses. - KVO IVF. - Continue his CHF medication but per tube.  RENAL A:  Hyperkalemia. P:   - Lasix for volume and hyperkalemia. - BMET in AM. - Replace electrolytes as indicated.  GASTROINTESTINAL A:  Appears malnutritioned. P:   - If unable to extubate in AM then will consult nutrition for TF. - Protonix as ordered.  HEMATOLOGIC A:  No active issues. P:  - SCD's for DVT prophylaxis.  Hold heparin for now.  INFECTIOUS A:  No active infection. P:   - Cefazolin post op per ortho.  ENDOCRINE A:  Diabetes by history.   P:   - CBG's - ISS.  NEUROLOGIC A:  Sedated, paralyzed and intubated for now. P:   - D/C dilaudid. - Fentanyl for pain control. - Precedex with the hope that we can wean with that tomorrow as behavior will be unpredictable given relayed history of dementia.  TODAY'S SUMMARY: 73 year old respiratory failure post op, extensive cardiac history, fluid overloaded.  Diurese today and wean in AM.  Note for services rendered on 4/7.  I have personally obtained a history, examined the patient, evaluated laboratory and imaging results, formulated the assessment and plan and placed orders.  CRITICAL CARE: The patient is critically ill with multiple organ systems failure and requires high complexity decision making for assessment  and  support, frequent evaluation and titration of therapies, application of advanced monitoring technologies and extensive interpretation of multiple databases. Critical Care Time devoted to patient care services described in this note is 35 minutes.   Alyson Reedy, M.D. [facility] Pulmonary/Critical Care Medicine. Pager: [phone]. After hours pager: [phone].

## 2013-12-05 NOTE — Progress Notes (Signed)
OT Cancellation Note  Patient Details Name: [name] [name] MRN: [id number] DOB: May 11, 1941   Cancelled Treatment:    Reason Eval/Treat Not Completed: Patient not medically ready (Pt intubated, sedated, and hypotensive.)   Lucrezia Dehne A Zanyah Lentsch 12/05/2013, 2:13 PM

## 2013-12-05 NOTE — Progress Notes (Signed)
eLink Physician-Brief Progress Note Patient Name: [name] [name] DOB: 06-22-1941 MRN: [id number]  Date of Service  12/05/2013   HPI/Events of Note   BP remains low.  eICU Interventions  Will give another 500 ml NS, and d/c coreg >> if no improvement, then may need pressors.   Intervention Category Major Interventions: Other:  Coralyn Helling 12/05/2013, 6:55 AM

## 2013-12-06 ENCOUNTER — Inpatient Hospital Stay (HOSPITAL_COMMUNITY): Payer: Medicare Other

## 2013-12-06 ENCOUNTER — Encounter (HOSPITAL_COMMUNITY): Payer: Self-pay | Admitting: Orthopedic Surgery

## 2013-12-06 LAB — GLUCOSE, CAPILLARY
GLUCOSE-CAPILLARY: 100 mg/dL — AB (ref 70–99)
GLUCOSE-CAPILLARY: 92 mg/dL (ref 70–99)
GLUCOSE-CAPILLARY: 153 mg/dL — AB (ref 70–99)
Glucose-Capillary: 33 mg/dL — CL (ref 70–99)
Glucose-Capillary: 60 mg/dL — ABNORMAL LOW (ref 70–99)
Glucose-Capillary: 73 mg/dL (ref 70–99)
Glucose-Capillary: 95 mg/dL (ref 70–99)
Glucose-Capillary: 154 mg/dL — ABNORMAL HIGH (ref 70–99)

## 2013-12-06 LAB — BLOOD GAS, ARTERIAL
Acid-base deficit: 0.6 mmol/L (ref 0.0–2.0)
BICARBONATE: 23.8 meq/L (ref 20.0–24.0)
Drawn by: 29017
FIO2: 40 %
MECHVT: 520 mL
O2 SAT: 99.9 %
PEEP/CPAP: 5 cmH2O
PO2 ART: 116 mmHg — AB (ref 80.0–100.0)
Patient temperature: 98.6
RATE: 16 resp/min
TCO2: 25.1 mmol/L (ref 0–100)
pCO2 arterial: 41.4 mmHg (ref 35.0–45.0)
pH, Arterial: 7.379 (ref 7.350–7.450)

## 2013-12-06 LAB — BASIC METABOLIC PANEL
BUN: 20 mg/dL (ref 6–23)
CHLORIDE: 90 meq/L — AB (ref 96–112)
CO2: 23 mEq/L (ref 19–32)
CREATININE: 0.68 mg/dL (ref 0.50–1.35)
Calcium: 7.9 mg/dL — ABNORMAL LOW (ref 8.4–10.5)
GFR calc non Af Amer: >90 mL/min (ref >90)
Glucose, Bld: 88 mg/dL (ref 70–99)
Potassium: 4.4 mEq/L (ref 3.7–5.3)
SODIUM: 126 meq/L — AB (ref 137–147)

## 2013-12-06 LAB — CBC
HCT: 23.6 % — ABNORMAL LOW (ref 39.0–52.0)
Hemoglobin: 8.7 g/dL — ABNORMAL LOW (ref 13.0–17.0)
MCH: 31.4 pg (ref 26.0–34.0)
MCHC: 36.9 g/dL — ABNORMAL HIGH (ref 30.0–36.0)
MCV: 85.2 fL (ref 78.0–100.0)
PLATELETS: 200 10*3/uL (ref 150–400)
RBC: 2.77 MIL/uL — ABNORMAL LOW (ref 4.22–5.81)
RDW: 12.7 % (ref 11.5–15.5)
WBC: 6.3 10*3/uL (ref 4.0–10.5)

## 2013-12-06 LAB — PHOSPHORUS: Phosphorus: 3.5 mg/dL (ref 2.3–4.6)

## 2013-12-06 LAB — MAGNESIUM: MAGNESIUM: 1.8 mg/dL (ref 1.5–2.5)

## 2013-12-06 MED ORDER — FUROSEMIDE 10 MG/ML IJ SOLN: 40.0000 mg | Freq: Once | INTRAMUSCULAR | Status: DC

## 2013-12-06 MED ORDER — ENOXAPARIN SODIUM 40 MG/0.4ML ~~LOC~~ SOLN
40.0000 mg | SUBCUTANEOUS | Status: DC
  Administered 2013-12-06 – 2013-12-09 (×4): 40 mg via SUBCUTANEOUS
  Filled 2013-12-06 (×6): qty 0.4

## 2013-12-06 MED ORDER — MAGNESIUM SULFATE 40 MG/ML IJ SOLN
2.0000 g | Freq: Once | INTRAMUSCULAR | Status: AC
  Administered 2013-12-06: 2 g via INTRAVENOUS
  Filled 2013-12-06: qty 50

## 2013-12-06 NOTE — Progress Notes (Signed)
Patient seen and examined, agree with above note.  I dictated the care and orders written for this patient under my direction.  Jaia Alonge G Timotheus Salm, MD [phone] 

## 2013-12-06 NOTE — Progress Notes (Signed)
Attempted to see pt today.  Pt has had a medical decline.  Will sign off of OT services at this time.  Please reorder when pt appropriate to resume therapy.  Tory Emerald, [place] [phone]

## 2013-12-06 NOTE — Procedures (Signed)
Extubation Procedure Note  Patient Details:   Name: [name] [name] DOB: 1941/03/29 MRN: [id number]   Airway Documentation:  Airway 7.5 mm (Active)  Secured at (cm) 23 cm 12/06/2013  9:08 AM  Measured From Lips 12/06/2013  9:08 AM  Secured Location Center 12/06/2013  9:08 AM  Secured By [employer] 12/06/2013  9:08 AM  Tube Holder Repositioned Yes 12/06/2013  9:08 AM  Cuff Pressure (cm H2O) 27 cm H2O 12/06/2013 12:27 AM  Site Condition Dry 12/06/2013  9:08 AM    Evaluation  O2 sats: stable throughout Complications: No apparent complications Patient did tolerate procedure well. Bilateral Breath Sounds: Diminished;Rhonchi Suctioning: Airway Yes   Pt extubated, no complications noted, to 2 LPM nasal cannula. Pt has clear BBS. Pt was able to give good productive cough and vocalize name. Vitals stable. RT will continue to monitor.   Jariah Jarmon M Baelynn Schmuhl 12/06/2013, 10:18 AM

## 2013-12-06 NOTE — Clinical Documentation Improvement (Signed)
INITIAL NUTRITION ASSESSMENT   12/05/2013   Possible Clinical Conditions?  Severe Malnutrition   Protein Calorie Malnutrition Severe Protein Calorie Malnutrition   Other Condition Cannot clinically determine  Supporting Information: Risk Factors: Vent support post-op, Hip Fracture after fall in SNF, DM, CHF, previous admission 12/01/13 Protein-calorie malnutrition, severe (Chronic) entered   Signs & Symptoms:Nutrition Focused Physical Exam:  Subcutaneous Fat:  Orbital Region: mild wasting  Upper Arm Region: moderate wasting  Thoracic and Lumbar Region: moderate-severe wasting Muscle:  Temple Region: severe wasting  Clavicle Bone Region: moderate wasting  Clavicle and Acromion Bone Region: moderate-severe wasting  Scapular Bone Region: severe wasting  Dorsal Hand: moderate wasting Edema: present  Diagnostics:  Ht: 5'6"  Wt: 140  BMI:22.6  Treatment:INTERVENTION:  1. Enteral nutrition; initiate Vital 1.2 @ 20 mL/hr continuous. Advance by 10 mL q 4 hrs to 50 ml/hr goal to provide 1440 kcal, 99g protein, 944 mL free water.   Monitor:  1. Enteral nutrition; initiation with tolerance. Pt to meet >/=90% estimated needs with nutrition support.  2. Wt. change; monitor trends  3. Protonix per tube daily    Thank You, Andy Gauss ,RN Clinical Documentation Specialist:  [phone]  [facility]- Health Information Management

## 2013-12-06 NOTE — Progress Notes (Signed)
PULMONARY / CRITICAL CARE MEDICINE   Name: [name] EmoryClyde Sakai MRN: [id number] DOB: [birth year]    ADMISSION DATE:  12/04/2013 CONSULTATION DATE:  12/04/2013  [facility] :  [facility]: [facility]  CHIEF COMPLAINT:  Respiratory failure.  BRIEF PATIENT DESCRIPTION: 73 year old male with extensive medical history was in rehab facility, fell and broke his hip.  Taken to the OR today for fixation.  Post op remained intubated.  PCCM asked to manage post op and liberate fro vent.  SIGNIFICANT EVENTS / STUDIES:  4/7 intubated post op.  LINES / TUBES: PIV ETT 4/7>>>4/9 R radial a-line 4/7>>>4/9  CULTURES: None  ANTIBIOTICS: Cefazolin post op 4/7>>>  SUBJECTIVE: No events overnight, interactive.  VITAL SIGNS: Temp:  [97.5 F (36.4 C)-98.8 F (37.1 C)] 98.1 F (36.7 C) (04/09 0736) Pulse Rate:  [71-87] 80 (04/09 1100) Resp:  [12-31] 16 (04/09 1100) BP: (70-135)/(42-81) 93/50 mmHg (04/09 1100) SpO2:  [90 %-100 %] 90 % (04/09 1100) FiO2 (%):  [40 %-50 %] 40 % (04/09 0908) Weight:  [132 lb 11.5 oz (60.2 kg)] 132 lb 11.5 oz (60.2 kg) (04/09 0600)  HEMODYNAMICS:   VENTILATOR SETTINGS: Vent Mode:  [-] PSV;CPAP FiO2 (%):  [40 %-50 %] 40 % Set Rate:  [16 bmp] 16 bmp Vt Set:  [520 mL] 520 mL PEEP:  [5 cmH20] 5 cmH20 Pressure Support:  [5 cmH20] 5 cmH20 Plateau Pressure:  [18 cmH20-21 cmH20] 18 cmH20  INTAKE / OUTPUT: Intake/Output     04/08 0701 - 04/09 0700 04/09 0701 - 04/10 0700   I.V. (mL/kg) 1029.8 (17.1) 542.7 (9)   NG/GT 280    IV Piggyback     Total Intake(mL/kg) 1309.8 (21.8) 542.7 (9)   Urine (mL/kg/hr) 1750 (1.2) 200 (0.7)   Blood     Total Output 1750 200   Net -440.2 +342.7         PHYSICAL EXAMINATION: General:  Chronically ill appearing male, intubated, arousable and follows command. Neuro:  Sedated, intubated, moves ext to command. HEENT:  [place]/AT, PERRL, EOM-I and MMM. Cardiovascular:  RRR, Nl S1/S2, -M/R/G. Lungs:  Coarse BS diffusely. Abdomen:  Soft,  NT, ND and +BS. Musculoskeletal:  -edema Skin:  Intact, surgical site clean.  LABS:  CBC  Recent Labs Lab 12/04/13 1217 12/04/13 2246 12/05/13 0410 12/06/13 0515  WBC 5.3  --  6.4 6.3  HGB 10.2* 9.2* 8.9* 8.7*  HCT 28.6* 27.0* 24.5* 23.6*  PLT 200  --  172 200   Coag's No results found for this basename: APTT, INR,  in the last 168 hours  BMET  Recent Labs Lab 12/04/13 1947 12/04/13 2246 12/05/13 0410 12/06/13 0515  NA 125* 124* 127* 126*  K 5.7* 4.8 4.7 4.4  CL 89*  --  90* 90*  CO2 23  --  24 23  BUN 27*  --  26* 20  CREATININE 0.80  --  0.77 0.68  GLUCOSE 122*  --  150* 88   Electrolytes  Recent Labs Lab 11/30/13 0411  12/04/13 1947 12/05/13 0410 12/06/13 0515  CALCIUM 9.2  < > 8.5 8.0* 7.9*  MG 1.7  --   --   --  1.8  PHOS 3.5  --   --   --  3.5  < > = values in this interval not displayed.  Sepsis Markers No results found for this basename: LATICACIDVEN, PROCALCITON, O2SATVEN,  in the last 168 hours  ABG  Recent Labs Lab 12/04/13 2246 12/04/13 2357 12/06/13 0500  PHART 7.423 7.407 7.379  PCO2ART 31.5* 39.3 41.4  PO2ART 62.0* 303.0* 116.0*   Liver Enzymes No results found for this basename: AST, ALT, ALKPHOS, BILITOT, ALBUMIN,  in the last 168 hours Cardiac Enzymes  Recent Labs Lab 12/01/13 0328 12/04/13 1449  TROPONINI  --  <0.30  PROBNP 9675.0* 5148.0*   Glucose  Recent Labs Lab 12/05/13 1148 12/05/13 1230 12/05/13 1407 12/05/13 1609 12/05/13 2351 12/06/13 0740  GLUCAP 38* 122* 73 51* 140* 92   Imaging Dg Hip Complete Left  12/04/2013   CLINICAL DATA:  Fall  EXAM: LEFT HIP - COMPLETE 2+ VIEW  COMPARISON:  None.  FINDINGS: Fractures of the left femoral neck with some impaction and angulation. Mild displacement. Hip joint appears normal on the left. Chronic healed fracture left ischial healed tuberosity. .  IMPRESSION: Left femoral neck fracture.   Electronically Signed   By: Marlan Palau M.D.   On: 12/04/2013 12:14   Dg  Pelvis Portable  12/05/2013   CLINICAL DATA:  Postoperative radiographs for left-sided hemiarthroplasty.  EXAM: PORTABLE PELVIS 1-2 VIEWS  COMPARISON:  Left hip radiographs performed earlier today at 11:56 a.m.  FINDINGS: The patient is status post left hip hemiarthroplasty. The prosthesis demonstrates normal alignment, without evidence of loosening. No new fractures are seen. Overlying soft tissue air is noted. The right hip is grossly unremarkable in appearance. The visualized portions of the sacroiliac joints are grossly unremarkable. The visualized bowel gas pattern is within normal limits.  IMPRESSION: Status post left hip hemiarthroplasty; the prosthesis demonstrates normal alignment, without evidence of loosening. No new fracture seen.   Electronically Signed   By: Roanna Raider M.D.   On: 12/05/2013 00:53   Dg Chest Port 1 View  12/05/2013   CLINICAL DATA:  Check endotracheal tube position.  EXAM: PORTABLE CHEST - 1 VIEW  COMPARISON:  DG CHEST 1V PORT dated 12/04/2013; CT CHEST W/CM dated 11/29/2013; DG CHEST 2 VIEW dated 11/28/2013  FINDINGS: Endotracheal tube terminates approximately 6.2 cm above the carina. Nasogastric tube is followed into the stomach with the tip projecting beyond the inferior margin of the image. Heart size stable. Thoracic aorta is calcified. Bibasilar airspace consolidation with bilateral pleural effusions, stable. There may be mild interstitial prominence as well.  IMPRESSION: 1. Bilateral effusions and bibasilar airspace consolidation. Pneumonia not excluded. 2. Possible mild interstitial prominence suggesting edema.   Electronically Signed   By: Leanna Battles M.D.   On: 12/05/2013 07:20   Dg Chest Portable 1 View  12/04/2013   CLINICAL DATA:  FALL HIP INJURY  EXAM: PORTABLE CHEST - 1 VIEW  COMPARISON:  CT CHEST W/CM dated 11/29/2013  FINDINGS: Cardiac silhouette is enlarged. A right pleural effusion is appreciated tracking along the lateral chest wall. A smaller left effusion is  identified. Areas of increased density projects within the lung bases. There is prominence of the interstitial markings. A wedge-shaped area of increased density extends from the right hilar region and appears to track along the minor minor fissure likely representing fluid. Degenerative changes are appreciated within the shoulders.  IMPRESSION: Interstitial findings likely reflecting pulmonary edema.  Bilateral pleural effusions right greater than left  Likely fluid along the minor fissure  Likely bibasilar atelectasis.   Electronically Signed   By: Salome Holmes M.D.   On: 12/04/2013 13:53   CXR: Pulmonary edema pre op.  ASSESSMENT / PLAN:  PULMONARY A: Intubated post op.  Pulmonary edema.  Bilateral pleural effusion.  Pulmonary nodule. P:   -  Extubate. - Titrate O2 as tolerated. - Hold diureses. - Pleural effusion chronic, no changes for now. - Will not address pulmonary nodule during acute illness.  CARDIOVASCULAR A: EF of 15% and grade 2 diastolic dysfunction. P:  - Diureses as ordered. - KVO IVF. - Continue his CHF medication but per tube. - If BP drops further then will hold coreg  RENAL A:  Hyperkalemia - improved with lasix. P:   - Single dose of lasix today. - BMET in AM. - Replace electrolytes as indicated.  GASTROINTESTINAL A:  Appears malnutritioned. P:   - Swallow evaluation. - Protonix as ordered. - Diet pending swallow evaluation.  HEMATOLOGIC A:  No active issues. P:  - SCD's for DVT prophylaxis. - Lovenox DVT prophylaxis as well - high risk given surgery and immobility.  INFECTIOUS A:  No active infection. P:   - Cefazolin post op per ortho.  ENDOCRINE A:  Diabetes by history.   P:   - CBG's - ISS.  NEUROLOGIC A:  Sedated, paralyzed and intubated for now. P:   - D/Ced dilaudid. - Fentanyl PRN. - D/C precedex.  TODAY'S SUMMARY: Lasix as ordered, extubate, head CT for tremor, may need neuro, replace electrolytes as ordered.  Discussed with  family and now a full DNR.  I have personally obtained a history, examined the patient, evaluated laboratory and imaging results, formulated the assessment and plan and placed orders.  CRITICAL CARE: The patient is critically ill with multiple organ systems failure and requires high complexity decision making for assessment and support, frequent evaluation and titration of therapies, application of advanced monitoring technologies and extensive interpretation of multiple databases. Critical Care Time devoted to patient care services described in this note is 35 minutes.   Alyson Reedy, M.D. [facility] Pulmonary/Critical Care Medicine. Pager: [phone]. After hours pager: [phone].

## 2013-12-06 NOTE — Progress Notes (Signed)
Physical Therapy Discharge Patient Details Name: [name] [name] MRN: [id number] DOB: 1940-12-08 Today's Date: 12/06/2013 Time:  -     Patient discharged from PT services secondary to medical decline - will need to re-order PT to resume therapy services.  Discharge plan discussed with patient and/or caregiver: Patient unable to participate in discharge planning and no caregivers available  GP     Zena Amos Pager [phone]  12/06/2013, 8:31 AM

## 2013-12-06 NOTE — Progress Notes (Signed)
Patient ID: [name] [name], male   DOB: 21-Nov-1940, 73 y.o.   MRN: [id number] Subjective: 2 Days Post-Op Procedure(s) (LRB): ARTHROPLASTY BIPOLAR HIP (Left)    Patient intubated, daughter slept in room last night with patient.  Stable overnight.  Decision was to keep him intubated to allow him to rest prior to extubation  Objective:   VITALS:   Filed Vitals:   12/06/13 1800  BP:   Pulse: 81  Temp:   Resp: 20    Incision: dressing C/D/I  LABS  Recent Labs  12/04/13 1217 12/04/13 2246 12/05/13 0410 12/06/13 0515  HGB 10.2* 9.2* 8.9* 8.7*  HCT 28.6* 27.0* 24.5* 23.6*  WBC 5.3  --  6.4 6.3  PLT 200  --  172 200     Recent Labs  12/04/13 1947 12/04/13 2246 12/05/13 0410 12/06/13 0515  NA 125* 124* 127* 126*  K 5.7* 4.8 4.7 4.4  BUN 27*  --  26* 20  CREATININE 0.80  --  0.77 0.68  GLUCOSE 122*  --  150* 88    No results found for this basename: LABPT, INR,  in the last 72 hours   Assessment/Plan: 2 Days Post-Op Procedure(s) (LRB): ARTHROPLASTY BIPOLAR HIP (Left)   {Plan: Per CCM with regards to intubation  Ortho WBAT lovenox for DVT prophylaxis when acceptable Will follow

## 2013-12-06 NOTE — Progress Notes (Signed)
Patient Name: [name] [name] Date of Encounter: 12/06/2013     Principal Problem:   Hip fracture, left Active Problems:   Chronic combined systolic and diastolic CHF (congestive heart failure):  Echo 11/29/13:  EF 15%, Grade two diastolic disfunction.   Type II or unspecified type diabetes mellitus without mention of complication, not stated as uncontrolled   Incidental lung nodule, > 70mm and < 81mm   Hypotension   Preoperative cardiovascular examination   Acute respiratory failure    SUBJECTIVE  The patient was successfully extubated this morning.  He is drowsy but able to respond to questions.  Blood pressure and pulse are remaining stable.  He is on a very tiny amount of dopamine now which we will stop.  He is having intermittent myoclonic jerks and his Lyrica is being stopped. CURRENT MEDS . antiseptic oral rinse  15 mL Mouth Rinse QID  . chlorhexidine  15 mL Mouth Rinse BID  . docusate sodium  100 mg Oral BID  . enoxaparin (LOVENOX) injection  30 mg Subcutaneous Q24H  . ferrous sulfate  325 mg Oral TID PC  . insulin aspart  2-6 Units Subcutaneous 6 times per day  . lipase/protease/amylase  1 capsule Oral TID WC  . multivitamin with minerals  1 tablet Oral Daily  . pantoprazole sodium  40 mg Per Tube Q1200  . pregabalin  50 mg Oral TID  . simvastatin  20 mg Oral q1800    OBJECTIVE  Filed Vitals:   12/06/13 0700 12/06/13 0736 12/06/13 0908 12/06/13 1019  BP:  124/70    Pulse: 79 82    Temp:  98.1 F (36.7 C)    TempSrc:  Oral    Resp: 16  12 16   Height:      Weight:      SpO2: 100% 100%  93%    Intake/Output Summary (Last 24 hours) at 12/06/13 1039 Last data filed at 12/06/13 0900  Gross per 24 hour  Intake 1282.32 ml  Output   1750 ml  Net -467.68 ml   Filed Weights   12/04/13 1800 12/04/13 2300 12/06/13 0600  Weight: 300 lb 0.7 oz (136.1 kg) 140 lb 3.4 oz (63.6 kg) 132 lb 11.5 oz (60.2 kg)    PHYSICAL EXAM  General: Extubated, drowsy but responds to  questions Neuro: Moves all extremities Psych: Not testable HEENT:  Normal  Neck: Supple without bruits or JVD. Lungs:  Resp regular and unlabored, CTA. Heart: RRR no s3, s4, or murmurs. Abdomen: Soft, non-tender, non-distended, BS + x 4.  Extremities: No pedal edema  Accessory Clinical Findings  CBC  Recent Labs  12/04/13 1217  12/05/13 0410 12/06/13 0515  WBC 5.3  --  6.4 6.3  NEUTROABS 3.8  --   --   --   HGB 10.2*  < > 8.9* 8.7*  HCT 28.6*  < > 24.5* 23.6*  MCV 87.2  --  85.4 85.2  PLT 200  --  172 200  < > = values in this interval not displayed. Basic Metabolic Panel  Recent Labs  12/05/13 0410 12/06/13 0515  NA 127* 126*  K 4.7 4.4  CL 90* 90*  CO2 24 23  GLUCOSE 150* 88  BUN 26* 20  CREATININE 0.77 0.68  CALCIUM 8.0* 7.9*  MG  --  1.8  PHOS  --  3.5   Liver Function Tests No results found for this basename: AST, ALT, ALKPHOS, BILITOT, PROT, ALBUMIN,  in the last 72 hours No  results found for this basename: LIPASE, AMYLASE,  in the last 72 hours Cardiac Enzymes  Recent Labs  12/04/13 1449  TROPONINI <0.30   BNP No components found with this basename: POCBNP,  D-Dimer No results found for this basename: DDIMER,  in the last 72 hours Hemoglobin A1C No results found for this basename: HGBA1C,  in the last 72 hours Fasting Lipid Panel No results found for this basename: CHOL, HDL, LDLCALC, TRIG, CHOLHDL, LDLDIRECT,  in the last 72 hours Thyroid Function Tests No results found for this basename: TSH, T4TOTAL, FREET3, T3FREE, THYROIDAB,  in the last 72 hours  TELE  Normal sinus rhythm  ECG    Radiology/Studies  Dg Chest 2 View  11/28/2013   CLINICAL DATA:  Fatigue and muscle weakness.  EXAM: CHEST  2 VIEW  COMPARISON:  None.  FINDINGS: Lung volumes are low. There is bibasilar collapse/ consolidation with small bilateral pleural effusions. No evidence for overt pulmonary edema at this time. The cardio pericardial silhouette is enlarged. Bones  are diffusely demineralized. Telemetry leads overlie the chest.  IMPRESSION: Bilateral lower lobe collapse/consolidation with small to moderate bilateral pleural effusions.   Electronically Signed   By: Kennith Center M.D.   On: 11/28/2013 20:24   Dg Hip Complete Left  12/04/2013   CLINICAL DATA:  Fall  EXAM: LEFT HIP - COMPLETE 2+ VIEW  COMPARISON:  None.  FINDINGS: Fractures of the left femoral neck with some impaction and angulation. Mild displacement. Hip joint appears normal on the left. Chronic healed fracture left ischial healed tuberosity. .  IMPRESSION: Left femoral neck fracture.   Electronically Signed   By: Marlan Palau M.D.   On: 12/04/2013 12:14   Ct Chest W Contrast  11/29/2013   CLINICAL DATA:  73 year old male with shortness of breath and cough. Recent weight gain and lower extremity swelling. Pleural effusions. Initial encounter.  EXAM: CT CHEST WITH CONTRAST  TECHNIQUE: Multidetector CT imaging of the chest was performed during intravenous contrast administration.  CONTRAST:  75mL OMNIPAQUE IOHEXOL 300 MG/ML  SOLN  COMPARISON:  Chest radiographs 11/28/2013.  FINDINGS: Moderate to large bilateral layering pleural effusions, the right is larger. Simple fluid densitometry suggesting transudate fluid. No pericardial effusion. Cardiomegaly. Widespread atherosclerotic plaque in the aorta. Coronary artery involvement.  Compressive atelectasis in both lungs. There is also a 7 mm right middle lobe lung nodule on series 3, image 38. Major airways are patent.  No acute or suspicious osseous lesion. Mild chronic left lateral tenth rib fracture. Mild scoliosis.  No axillary or mediastinal lymphadenopathy.  Visualized upper abdomen remarkable for confluent dystrophic calcifications throughout the visible pancreas.  IMPRESSION: 1. Moderate to large right greater than left layering pleural effusions with compressive atelectasis. 2. Seven mm right middle lobe lung nodule (series 3, image 38). If the patient  is at high risk for bronchogenic carcinoma, follow-up chest CT at 3-6 months is recommended. If the patient is at low risk for bronchogenic carcinoma, follow-up chest CT at 6-12 months is recommended. This recommendation follows the consensus statement: Guidelines for Management of Small Pulmonary Nodules Detected on CT Scans: A Statement from the Fleischner Society as published in Radiology 2005; 237:395-400. 3. Chronic calcific pancreatitis.   Electronically Signed   By: Augusto Gamble M.D.   On: 11/29/2013 13:54   Dg Pelvis Portable  12/05/2013   CLINICAL DATA:  Postoperative radiographs for left-sided hemiarthroplasty.  EXAM: PORTABLE PELVIS 1-2 VIEWS  COMPARISON:  Left hip radiographs performed earlier today at 11:56  a.m.  FINDINGS: The patient is status post left hip hemiarthroplasty. The prosthesis demonstrates normal alignment, without evidence of loosening. No new fractures are seen. Overlying soft tissue air is noted. The right hip is grossly unremarkable in appearance. The visualized portions of the sacroiliac joints are grossly unremarkable. The visualized bowel gas pattern is within normal limits.  IMPRESSION: Status post left hip hemiarthroplasty; the prosthesis demonstrates normal alignment, without evidence of loosening. No new fracture seen.   Electronically Signed   By: Roanna RaiderJeffery  Chang M.D.   On: 12/05/2013 00:53   Dg Chest Port 1 View  12/05/2013   CLINICAL DATA:  Check endotracheal tube position.  EXAM: PORTABLE CHEST - 1 VIEW  COMPARISON:  DG CHEST 1V PORT dated 12/04/2013; CT CHEST W/CM dated 11/29/2013; DG CHEST 2 VIEW dated 11/28/2013  FINDINGS: Endotracheal tube terminates approximately 6.2 cm above the carina. Nasogastric tube is followed into the stomach with the tip projecting beyond the inferior margin of the image. Heart size stable. Thoracic aorta is calcified. Bibasilar airspace consolidation with bilateral pleural effusions, stable. There may be mild interstitial prominence as well.   IMPRESSION: 1. Bilateral effusions and bibasilar airspace consolidation. Pneumonia not excluded. 2. Possible mild interstitial prominence suggesting edema.   Electronically Signed   By: Leanna BattlesMelinda  Blietz M.D.   On: 12/05/2013 07:20   Dg Chest Portable 1 View  12/04/2013   CLINICAL DATA:  FALL HIP INJURY  EXAM: PORTABLE CHEST - 1 VIEW  COMPARISON:  CT CHEST W/CM dated 11/29/2013  FINDINGS: Cardiac silhouette is enlarged. A right pleural effusion is appreciated tracking along the lateral chest wall. A smaller left effusion is identified. Areas of increased density projects within the lung bases. There is prominence of the interstitial markings. A wedge-shaped area of increased density extends from the right hilar region and appears to track along the minor minor fissure likely representing fluid. Degenerative changes are appreciated within the shoulders.  IMPRESSION: Interstitial findings likely reflecting pulmonary edema.  Bilateral pleural effusions right greater than left  Likely fluid along the minor fissure  Likely bibasilar atelectasis.   Electronically Signed   By: Salome HolmesHector  Cooper M.D.   On: 12/04/2013 13:53    ASSESSMENT AND PLAN 1. Severe left ventricular dysfunction with ejection fraction of 15% and grade 2 diastolic dysfunction. Recent admission for CHF.  He appears to be euvolemic at present  2. postop left hip hemiarthroplasty 12/04/13  3. postop anemia 4. myoclonic jerks possibly secondary to Lyrica which has been stopped/  Plan: Dopamine has been weaned and we will DC it now. Follow hemoglobin per CCM. Consider restart of carvedilol tomorrow depending on blood pressure.   Signed, Cassell Clementhomas Lavone Weisel MD

## 2013-12-06 NOTE — Progress Notes (Signed)
Chaplain gave support to pt and pt's daughter, son-in-law and friend.  Chaplain offered a caring presence through compassionate conversation, listening and prayer.     12/06/13 1500  Clinical Encounter Type  Visited With Patient and family together  Visit Type Spiritual support;Critical Care  Spiritual Encounters  Spiritual Needs Emotional   Rulon Abide, chaplain [phone]

## 2013-12-07 LAB — GLUCOSE, CAPILLARY
GLUCOSE-CAPILLARY: 137 mg/dL — AB (ref 70–99)
GLUCOSE-CAPILLARY: 145 mg/dL — AB (ref 70–99)
Glucose-Capillary: 144 mg/dL — ABNORMAL HIGH (ref 70–99)
Glucose-Capillary: 149 mg/dL — ABNORMAL HIGH (ref 70–99)
Glucose-Capillary: 210 mg/dL — ABNORMAL HIGH (ref 70–99)
Glucose-Capillary: 222 mg/dL — ABNORMAL HIGH (ref 70–99)

## 2013-12-07 LAB — CBC
HCT: 20.6 % — ABNORMAL LOW (ref 39.0–52.0)
Hemoglobin: 7.6 g/dL — ABNORMAL LOW (ref 13.0–17.0)
MCH: 31.1 pg (ref 26.0–34.0)
MCHC: 36.9 g/dL — AB (ref 30.0–36.0)
MCV: 84.4 fL (ref 78.0–100.0)
PLATELETS: 204 10*3/uL (ref 150–400)
RBC: 2.44 MIL/uL — AB (ref 4.22–5.81)
RDW: 12.6 % (ref 11.5–15.5)
WBC: 7.4 10*3/uL (ref 4.0–10.5)

## 2013-12-07 LAB — PHOSPHORUS: Phosphorus: 2.9 mg/dL (ref 2.3–4.6)

## 2013-12-07 LAB — BASIC METABOLIC PANEL
BUN: 17 mg/dL (ref 6–23)
CALCIUM: 8 mg/dL — AB (ref 8.4–10.5)
CO2: 24 meq/L (ref 19–32)
CREATININE: 0.62 mg/dL (ref 0.50–1.35)
Chloride: 88 mEq/L — ABNORMAL LOW (ref 96–112)
GFR calc Af Amer: >90 mL/min (ref >90)
Glucose, Bld: 140 mg/dL — ABNORMAL HIGH (ref 70–99)
Potassium: 4.1 mEq/L (ref 3.7–5.3)
Sodium: 125 mEq/L — ABNORMAL LOW (ref 137–147)

## 2013-12-07 LAB — MAGNESIUM: Magnesium: 2.2 mg/dL (ref 1.5–2.5)

## 2013-12-07 MED ORDER — CARVEDILOL 3.125 MG PO TABS
3.1250 mg | ORAL_TABLET | Freq: Two times a day (BID) | ORAL | Status: DC
  Administered 2013-12-07 – 2013-12-10 (×8): 3.125 mg via ORAL
  Filled 2013-12-07 (×9): qty 1

## 2013-12-07 MED ORDER — INSULIN ASPART 100 UNIT/ML ~~LOC~~ SOLN: 0.0000 [IU] | Freq: Three times a day (TID) | SUBCUTANEOUS | Status: DC

## 2013-12-07 MED ORDER — INSULIN ASPART 100 UNIT/ML ~~LOC~~ SOLN
0.0000 [IU] | Freq: Every day | SUBCUTANEOUS | Status: DC
  Administered 2013-12-09: 2 [IU] via SUBCUTANEOUS

## 2013-12-07 MED ORDER — INSULIN ASPART 100 UNIT/ML ~~LOC~~ SOLN
0.0000 [IU] | Freq: Three times a day (TID) | SUBCUTANEOUS | Status: DC
  Administered 2013-12-07 – 2013-12-08 (×2): 3 [IU] via SUBCUTANEOUS
  Administered 2013-12-08: 5 [IU] via SUBCUTANEOUS
  Administered 2013-12-08: 1 [IU] via SUBCUTANEOUS
  Administered 2013-12-09: 2 [IU] via SUBCUTANEOUS
  Administered 2013-12-09: 3 [IU] via SUBCUTANEOUS

## 2013-12-07 MED ORDER — ENSURE COMPLETE PO LIQD
237.0000 mL | Freq: Two times a day (BID) | ORAL | Status: DC
  Administered 2013-12-07 – 2013-12-10 (×7): 237 mL via ORAL

## 2013-12-07 NOTE — Progress Notes (Signed)
PULMONARY / CRITICAL CARE MEDICINE   Name: [name] [name] MRN: [id number] DOB: [birth year]    ADMISSION DATE:  12/04/2013 CONSULTATION DATE:  12/04/2013  REFERRING MD :  [facility] PRIMARY SERVICE: TRH  CHIEF COMPLAINT:  Respiratory failure.  BRIEF PATIENT DESCRIPTION: 73 year old male with extensive medical history was in rehab facility, fell and broke his hip.  Taken to the OR today for fixation.  Post op remained intubated.  PCCM asked to manage post op and liberate fro vent.  SIGNIFICANT EVENTS / STUDIES:  4/7 left hip hemiarthroplasty 4/7 intubated post op.  LINES / TUBES: PIV ETT 4/7>>>4/9 R radial a-line 4/7>>>4/9  CULTURES: None  ANTIBIOTICS: Cefazolin post op 4/7>>>  SUBJECTIVE: No acute events overnight, interactive.  CBGs elevated.  VITAL SIGNS: Temp:  [97.8 F (36.6 C)-99.6 F (37.6 C)] 98.8 F (37.1 C) (04/10 0400) Pulse Rate:  [69-99] 90 (04/10 0600) Resp:  [12-24] 14 (04/10 0600) BP: (66-152)/(31-78) 127/57 mmHg (04/10 0600) SpO2:  [85 %-100 %] 100 % (04/10 0600) FiO2 (%):  [40 %] 40 % (04/09 0908)  HEMODYNAMICS:   VENTILATOR SETTINGS: Vent Mode:  [-] PSV;CPAP FiO2 (%):  [40 %] 40 % PEEP:  [5 cmH20] 5 cmH20 Pressure Support:  [5 cmH20] 5 cmH20  INTAKE / OUTPUT: Intake/Output     04/09 0701 - 04/10 0700 04/10 0701 - 04/11 0700   I.V. (mL/kg) 1338.8 (22.2)    NG/GT     Total Intake(mL/kg) 1338.8 (22.2)    Urine (mL/kg/hr) 850 (0.6)    Total Output 850     Net +488.8          Urine Occurrence 1 x     PHYSICAL EXAMINATION: General:  Chronically ill appearing male, intubated, arousable and follows command. Neuro:  Follows commands, oriented to person, city, and somewhat to situation; not oriented to place, year HEENT:  [place]/AT, PERRL, EOM-I and MMM. Cardiovascular:  RRR, Nl S1/S2, -M/R/G. Lungs:  Coarse BS diffusely. Abdomen:  Soft, NT, ND and +BS. Musculoskeletal:  No pedal edema, wiggles toes Skin:  Intact, surgical site clean.    LABS:  CBC  Recent Labs Lab 12/05/13 0410 12/06/13 0515 12/07/13 0500  WBC 6.4 6.3 7.4  HGB 8.9* 8.7* 7.6*  HCT 24.5* 23.6* 20.6*  PLT 172 200 204   Coag's No results found for this basename: APTT, INR,  in the last 168 hours  BMET  Recent Labs Lab 12/05/13 0410 12/06/13 0515 12/07/13 0500  NA 127* 126* 125*  K 4.7 4.4 4.1  CL 90* 90* 88*  CO2 24 23 24   BUN 26* 20 17  CREATININE 0.77 0.68 0.62  GLUCOSE 150* 88 140*   Electrolytes  Recent Labs Lab 12/05/13 0410 12/06/13 0515 12/07/13 0500  CALCIUM 8.0* 7.9* 8.0*  MG  --  1.8 2.2  PHOS  --  3.5 2.9    Sepsis Markers No results found for this basename: LATICACIDVEN, PROCALCITON, O2SATVEN,  in the last 168 hours  ABG  Recent Labs Lab 12/04/13 2246 12/04/13 2357 12/06/13 0500  PHART 7.423 7.407 7.379  PCO2ART 31.5* 39.3 41.4  PO2ART 62.0* 303.0* 116.0*   Liver Enzymes No results found for this basename: AST, ALT, ALKPHOS, BILITOT, ALBUMIN,  in the last 168 hours Cardiac Enzymes  Recent Labs Lab 12/01/13 0328 12/04/13 1449  TROPONINI  --  <0.30  PROBNP 9675.0* 5148.0*   Glucose  Recent Labs Lab 12/06/13 0740 12/06/13 1155 12/06/13 1719 12/06/13 2108 12/07/13 0024 12/07/13 0441  GLUCAP 92 153* 154*  137* 149* 144*   Imaging Ct Head Wo Contrast  12/06/2013   CLINICAL DATA:  Altered mental status.  EXAM: CT HEAD WITHOUT CONTRAST  TECHNIQUE: Contiguous axial images were obtained from the base of the skull through the vertex without intravenous contrast.  COMPARISON:  None  FINDINGS: Ventricles are normal in configuration. There is ventricular and sulcal enlargement reflecting moderate atrophy.  There are no parenchymal masses or mass effect. There is no evidence of a cortical infarct. Mild patchy areas of white matter hypoattenuation that are consistent with chronic microvascular ischemic change.  There are no extra-axial masses or abnormal fluid collections.  There is no intracranial  hemorrhage.  Visualized sinuses and mastoid air cells are clear. No skull lesion.  IMPRESSION: No acute intracranial abnormalities.  Moderate atrophy and mild chronic microvascular ischemic change.   Electronically Signed   By: Amie Portlandavid  Ormond M.D.   On: 12/06/2013 14:57   CXR: Pulmonary edema pre op.  ASSESSMENT / PLAN:  PULMONARY A: Intubated post op, extubated 4/9.  Pulmonary edema.  Bilateral pleural effusion.  Pulmonary nodule. P:   - Titrate O2 as tolerated. - Restart home lasix 20mg  PO daily - Pleural effusion chronic, no changes for now. - Will not address pulmonary nodule during acute illness.  CARDIOVASCULAR A: EF of 15% and grade 2 diastolic dysfunction. P:  - Diureses as ordered. - KVO IVF. - Restart his CHF medications when cleared for diet & if pressures can tolerate - Appreciate cardiology input  RENAL A:  Hyperkalemia - improved with lasix. Hyponatremia, chronic. P:   - Restart home lasix - BMET in AM. - Replace electrolytes as indicated.  GASTROINTESTINAL A:  Appears malnutritioned. P:   - Swallow evaluation pending - Protonix as ordered. - Diet pending swallow evaluation.  HEMATOLOGIC A:  No active issues. P:  - SCD's for DVT prophylaxis. - Lovenox DVT prophylaxis as well - high risk given surgery and immobility.  INFECTIOUS A:  No active infection. P:   - Cefazolin post op per ortho.  ENDOCRINE A:  Diabetes by history.   P:   - CBG's - ISS. - d/c D10 gtt, CBGs stabilized  NEUROLOGIC A:  Sedated, paralyzed and intubated for now. P:   - Fentanyl PRN.  MSK A: S/P left hip arthroplasty by Dr. Charlann Boxerlin P: -WBAT per ortho  GLOBAL: DNR  --> [facility] PCCM to sign off   TODAY'S SUMMARY: [facility], PCCM will sign off, please call back if needed.  I have personally obtained a history, examined the patient, evaluated laboratory and imaging results, formulated the assessment and plan and placed  orders.  Alyson ReedyWesam G. Shizue Kaseman, M.D. [facility] Pulmonary/Critical Care Medicine. Pager: [phone]. After hours pager: [phone].

## 2013-12-07 NOTE — Progress Notes (Signed)
Clinical Social Work Department BRIEF PSYCHOSOCIAL ASSESSMENT 12/07/2013  Patient:  [name] [name],[name] [name]     Account Number:  [id number]     Admit date:  12/04/2013  Clinical Social Worker:  Varney Biles  Date/Time:  12/07/2013 12:43 PM  Referred by:  Physician  Date Referred:  12/07/2013 Referred for  SNF Placement   Other Referral:   Interview type:  Family Other interview type:    PSYCHOSOCIAL DATA Living Status:  FACILITY Admitted from facility:  [facility] Level of care:  Skilled Nursing Facility Primary support name:  Alysia Penna [phone]) Primary support relationship to patient:  FRIEND Degree of support available:   Good--pt's daughter and pt's mother Karen Kitchens involved in care plan.    CURRENT CONCERNS Current Concerns  Post-Acute Placement   Other Concerns:    SOCIAL WORK ASSESSMENT / PLAN Spoke with pt's daughter Amil Amen, who confirmed pt was at [facility] prior to hospitalization and requested CSW call her mother Karen Kitchens about discharge. Pt was at [facility] for less than 24 hours, fell and came back to the hospital with broken hip, per pt's friend Dottie. Dottie asked questions about Medicare and placement, and CSW answered questions. Sent [facility] a message requesting they contact CSW in the event they are unable to take pt back for any reason.   Assessment/plan status:  Psychosocial Support/Ongoing Assessment of Needs Other assessment/ plan:   Information/referral to community resources:   ?Quarry manager    PATIENT'S/FAMILY'S RESPONSE TO PLAN OF CARE: Good--pt's daughter and friend involved in pt care. CSW following to provide support and assist with discharge.       Maryclare Labrador, MSW, [facility] Clinical Social Worker [phone]

## 2013-12-07 NOTE — Progress Notes (Signed)
NUTRITION FOLLOW UP  Intervention:   1.  General healthful diet; encourage intake of foods and beverages as able.  RD to follow and assess for nutritional adequacy.  2. Supplements; Ensure Complete po BID, each supplement provides 350 kcal and 13 grams of protein  Nutrition Dx:   Inadequate oral intake related to inability to eat as evidenced by NPO, intubated.   Monitor:  1. Enteral nutrition; initiation with tolerance. Pt to meet >/=90% estimated needs with nutrition support.  2. Wt/wt change; monitor trends  Assessment:   Pt admitted with hip fx; s/p repair.  Pt remained intubated post-op but has since been extubated.  Pt evaluated by SLP today who determined pt is appropriate for Regular diet with thin liquids. Pt to receive first tray at lunch.  Will add supplements for pt. RD to follow for ongoing assistance in meeting needs and ensuring adequacy of intake.   Height: Ht Readings from Last 1 Encounters:  12/04/13 5\' 6"  (1.676 m)    Weight Status:   Wt Readings from Last 1 Encounters:  12/06/13 132 lb 11.5 oz (60.2 kg)    Re-estimated needs:  Kcal: 1850-2080 Protein: 89-102g Fluid: ~2.0 L/day  Skin: intact  Diet Order:  NPO   Intake/Output Summary (Last 24 hours) at 12/07/13 0850 Last data filed at 12/07/13 0600  Gross per 24 hour  Intake 796.08 ml  Output    850 ml  Net -53.92 ml    Last BM: 4/6   Labs:   Recent Labs Lab 12/05/13 0410 12/06/13 0515 12/07/13 0500  NA 127* 126* 125*  K 4.7 4.4 4.1  CL 90* 90* 88*  CO2 24 23 24   BUN 26* 20 17  CREATININE 0.77 0.68 0.62  CALCIUM 8.0* 7.9* 8.0*  MG  --  1.8 2.2  PHOS  --  3.5 2.9  GLUCOSE 150* 88 140*    CBG (last 3)   Recent Labs  12/06/13 2108 12/07/13 0024 12/07/13 0441  GLUCAP 137* 149* 144*    Scheduled Meds: . antiseptic oral rinse  15 mL Mouth Rinse QID  . chlorhexidine  15 mL Mouth Rinse BID  . docusate sodium  100 mg Oral BID  . enoxaparin (LOVENOX) injection  40 mg  Subcutaneous Q24H  . ferrous sulfate  325 mg Oral TID PC  . furosemide  40 mg Intravenous Once  . insulin aspart  2-6 Units Subcutaneous 6 times per day  . lipase/protease/amylase  1 capsule Oral TID WC  . multivitamin with minerals  1 tablet Oral Daily  . pantoprazole sodium  40 mg Per Tube Q1200  . simvastatin  20 mg Oral q1800    Continuous Infusions: . sodium chloride Stopped (12/06/13 1200)  . feeding supplement (VITAL AF 1.2 CAL) Stopped (12/06/13 0430)    Loyce Dys, MS RD LDN Clinical Inpatient Dietitian Pager: [phone] Weekend/After hours pager: [phone]

## 2013-12-07 NOTE — Evaluation (Signed)
Physical Therapy Evaluation Patient Details Name: [name] [name] MRN: [id number] DOB: February 18, 1941 Today's Date: 12/07/2013   History of Present Illness  Patient just d/c from hospital to SNF.  One night at SNF, and patient had fall the following day resulting in Lt hip fx.  Daughter reports 3 falls in last several months.  Patient with h/o DM, ETOH abuse, dementia, CHF.  Clinical Impression  Patient presents with problems listed below.  Will benefit from acute PT to address mobility issues.  Recommend patient return to SNF at discharge for continued therapy.    Follow Up Recommendations SNF;Supervision/Assistance - 24 hour    Equipment Recommendations  Rolling walker with 5" wheels    Recommendations for Other Services       Precautions / Restrictions Precautions Precautions: Fall Precaution Comments: Daughter reports 3 falls recently Restrictions Weight Bearing Restrictions: Yes LLE Weight Bearing: Weight bearing as tolerated      Mobility  Bed Mobility Overal bed mobility: Needs Assistance Bed Mobility: Supine to Sit;Sit to Supine     Supine to sit: Mod assist Sit to supine: Max assist;+2 for physical assistance   General bed mobility comments: Verbal and tactile cues for technique.  Required increased time to respond to cuing.  Once upright, patient able to maintain balance with min guard assist.  Sat EOB x 10 minutes.  Patient fatigued quickly.  Transfers                    Ambulation/Gait                Stairs            Wheelchair Mobility    Modified Rankin (Stroke Patients Only)       Balance Overall balance assessment: Needs assistance Sitting-balance support: Single extremity supported;Feet supported Sitting balance-Leahy Scale: Fair                                       Pertinent Vitals/Pain     Home Living Family/patient expects to be discharged to:: Skilled nursing facility               Home  Equipment: [employer] - single point      Prior Function Level of Independence: Needs assistance   Gait / Transfers Assistance Needed: Assist with ambulation - unsteady.  Using cane  ADL's / Homemaking Assistance Needed: Assist with bathing, dressing at SNF        Hand Dominance        Extremity/Trunk Assessment   Upper Extremity Assessment: Generalized weakness           Lower Extremity Assessment: Generalized weakness;LLE deficits/detail   LLE Deficits / Details: Strength grossly 3/5.  Pain with movement.     Communication   Communication: No difficulties  Cognition Arousal/Alertness: Awake/alert Behavior During Therapy: Impulsive;Anxious Overall Cognitive Status: Impaired/Different from baseline Area of Impairment: Orientation;Attention;Memory;Following commands;Safety/judgement;Problem solving;Awareness Orientation Level: Disoriented to;Place;Time;Situation Current Attention Level: Sustained Memory: Decreased short-term memory Following Commands: Follows one step commands inconsistently;Follows one step commands with increased time Safety/Judgement: Decreased awareness of deficits;Decreased awareness of safety   Problem Solving: Slow processing;Difficulty sequencing;Requires verbal cues General Comments: pt with history of dementia; family reports it has been worsening     General Comments      Exercises        Assessment/Plan    PT Assessment Patient needs continued PT services  PT Diagnosis Difficulty walking;Generalized weakness;Acute pain;Altered mental status   PT Problem List Decreased strength;Decreased activity tolerance;Decreased balance;Decreased mobility;Decreased cognition;Decreased knowledge of use of DME;Decreased safety awareness;Cardiopulmonary status limiting activity;Pain  PT Treatment Interventions DME instruction;Gait training;Functional mobility training;Therapeutic exercise;Balance training;Cognitive remediation;Patient/family education    PT Goals (Current goals can be found in the Care Plan section) Acute Rehab PT Goals Patient Stated Goal: None stated  PT Goal Formulation: With patient/family Time For Goal Achievement: 12/21/13 Potential to Achieve Goals: Fair    Frequency Min 6X/week   Barriers to discharge        Co-evaluation               End of Session Equipment Utilized During Treatment: Oxygen Activity Tolerance: Patient limited by pain;Patient limited by fatigue Patient left: in bed;with call bell/phone within reach;with family/visitor present;with bed alarm set Nurse Communication: Mobility status         Time: 1211-1232 PT Time Calculation (min): 21 min   Charges:   PT Evaluation $Initial PT Evaluation Tier I: 1 Procedure PT Treatments $Therapeutic Activity: 8-22 mins   PT G Codes:          Vena AustriaSusan H Ariane Ditullio 12/07/2013, 2:46 PM Durenda HurtSusan H. Renaldo Fiddleravis, PT, [facility] Center[facility]BA Acute Rehab Services Pager [phone]

## 2013-12-07 NOTE — Progress Notes (Signed)
Patient Name: [name] [name] Date of Encounter: 12/07/2013     Principal Problem:   Hip fracture, left Active Problems:   Chronic combined systolic and diastolic CHF (congestive heart failure):  Echo 11/29/13:  EF 15%, Grade two diastolic disfunction.   Type II or unspecified type diabetes mellitus without mention of complication, not stated as uncontrolled   Incidental lung nodule, > 1mm and < 49mm   Hypotension   Preoperative cardiovascular examination   Acute respiratory failure    SUBJECTIVE  The patient is alert.  In no acute distress.  Denies chest pain or dyspnea.  Rhythm is normal sinus rhythm.  Blood pressure is stable.  CURRENT MEDS . antiseptic oral rinse  15 mL Mouth Rinse QID  . chlorhexidine  15 mL Mouth Rinse BID  . docusate sodium  100 mg Oral BID  . enoxaparin (LOVENOX) injection  40 mg Subcutaneous Q24H  . ferrous sulfate  325 mg Oral TID PC  . furosemide  40 mg Intravenous Once  . insulin aspart  2-6 Units Subcutaneous 6 times per day  . lipase/protease/amylase  1 capsule Oral TID WC  . multivitamin with minerals  1 tablet Oral Daily  . pantoprazole sodium  40 mg Per Tube Q1200  . simvastatin  20 mg Oral q1800    OBJECTIVE  Filed Vitals:   12/07/13 0300 12/07/13 0400 12/07/13 0600 12/07/13 0754  BP:  152/66 127/57 121/61  Pulse: 95 99 90 89  Temp:  98.8 F (37.1 C)  99.2 F (37.3 C)  TempSrc:  Oral  Oral  Resp: 17 17 14    Height:      Weight:      SpO2: 99% 98% 100% 100%    Intake/Output Summary (Last 24 hours) at 12/07/13 1020 Last data filed at 12/07/13 0600  Gross per 24 hour  Intake 796.08 ml  Output    650 ml  Net 146.08 ml   Filed Weights   12/04/13 1800 12/04/13 2300 12/06/13 0600  Weight: 300 lb 0.7 oz (136.1 kg) 140 lb 3.4 oz (63.6 kg) 132 lb 11.5 oz (60.2 kg)    PHYSICAL EXAM  General: Pleasant, NAD. Neuro: Alert and oriented X 3. Moves all extremities spontaneously. Psych: Normal affect. HEENT:  Normal  Neck: Supple  without bruits or JVD. Lungs:  Decreased breath sounds at bases Heart: RRR no s3, s4, or murmurs. Abdomen: Soft, non-tender, non-distended, BS + x 4.  Extremities: No clubbing, cyanosis or edema. DP/PT/Radials 2+ and equal bilaterally.  Accessory Clinical Findings  CBC  Recent Labs  12/04/13 1217  12/06/13 0515 12/07/13 0500  WBC 5.3  < > 6.3 7.4  NEUTROABS 3.8  --   --   --   HGB 10.2*  < > 8.7* 7.6*  HCT 28.6*  < > 23.6* 20.6*  MCV 87.2  < > 85.2 84.4  PLT 200  < > 200 204  < > = values in this interval not displayed. Basic Metabolic Panel  Recent Labs  12/06/13 0515 12/07/13 0500  NA 126* 125*  K 4.4 4.1  CL 90* 88*  CO2 23 24  GLUCOSE 88 140*  BUN 20 17  CREATININE 0.68 0.62  CALCIUM 7.9* 8.0*  MG 1.8 2.2  PHOS 3.5 2.9   Liver Function Tests No results found for this basename: AST, ALT, ALKPHOS, BILITOT, PROT, ALBUMIN,  in the last 72 hours No results found for this basename: LIPASE, AMYLASE,  in the last 72 hours Cardiac Enzymes  Recent  Labs  12/04/13 1449  TROPONINI <0.30   BNP No components found with this basename: POCBNP,  D-Dimer No results found for this basename: DDIMER,  in the last 72 hours Hemoglobin A1C No results found for this basename: HGBA1C,  in the last 72 hours Fasting Lipid Panel No results found for this basename: CHOL, HDL, LDLCALC, TRIG, CHOLHDL, LDLDIRECT,  in the last 72 hours Thyroid Function Tests No results found for this basename: TSH, T4TOTAL, FREET3, T3FREE, THYROIDAB,  in the last 72 hours  TELE  Normal sinus rhythm  ECG    Radiology/Studies  Dg Chest 2 View  11/28/2013   CLINICAL DATA:  Fatigue and muscle weakness.  EXAM: CHEST  2 VIEW  COMPARISON:  None.  FINDINGS: Lung volumes are low. There is bibasilar collapse/ consolidation with small bilateral pleural effusions. No evidence for overt pulmonary edema at this time. The cardio pericardial silhouette is enlarged. Bones are diffusely demineralized. Telemetry  leads overlie the chest.  IMPRESSION: Bilateral lower lobe collapse/consolidation with small to moderate bilateral pleural effusions.   Electronically Signed   By: Kennith CenterEric  Mansell M.D.   On: 11/28/2013 20:24   Dg Hip Complete Left  12/04/2013   CLINICAL DATA:  Fall  EXAM: LEFT HIP - COMPLETE 2+ VIEW  COMPARISON:  None.  FINDINGS: Fractures of the left femoral neck with some impaction and angulation. Mild displacement. Hip joint appears normal on the left. Chronic healed fracture left ischial healed tuberosity. .  IMPRESSION: Left femoral neck fracture.   Electronically Signed   By: Marlan Palauharles  Clark M.D.   On: 12/04/2013 12:14   Ct Head Wo Contrast  12/06/2013   CLINICAL DATA:  Altered mental status.  EXAM: CT HEAD WITHOUT CONTRAST  TECHNIQUE: Contiguous axial images were obtained from the base of the skull through the vertex without intravenous contrast.  COMPARISON:  None  FINDINGS: Ventricles are normal in configuration. There is ventricular and sulcal enlargement reflecting moderate atrophy.  There are no parenchymal masses or mass effect. There is no evidence of a cortical infarct. Mild patchy areas of white matter hypoattenuation that are consistent with chronic microvascular ischemic change.  There are no extra-axial masses or abnormal fluid collections.  There is no intracranial hemorrhage.  Visualized sinuses and mastoid air cells are clear. No skull lesion.  IMPRESSION: No acute intracranial abnormalities.  Moderate atrophy and mild chronic microvascular ischemic change.   Electronically Signed   By: Amie Portlandavid  Ormond M.D.   On: 12/06/2013 14:57   Ct Chest W Contrast  11/29/2013   CLINICAL DATA:  73 year old male with shortness of breath and cough. Recent weight gain and lower extremity swelling. Pleural effusions. Initial encounter.  EXAM: CT CHEST WITH CONTRAST  TECHNIQUE: Multidetector CT imaging of the chest was performed during intravenous contrast administration.  CONTRAST:  75mL OMNIPAQUE IOHEXOL 300  MG/ML  SOLN  COMPARISON:  Chest radiographs 11/28/2013.  FINDINGS: Moderate to large bilateral layering pleural effusions, the right is larger. Simple fluid densitometry suggesting transudate fluid. No pericardial effusion. Cardiomegaly. Widespread atherosclerotic plaque in the aorta. Coronary artery involvement.  Compressive atelectasis in both lungs. There is also a 7 mm right middle lobe lung nodule on series 3, image 38. Major airways are patent.  No acute or suspicious osseous lesion. Mild chronic left lateral tenth rib fracture. Mild scoliosis.  No axillary or mediastinal lymphadenopathy.  Visualized upper abdomen remarkable for confluent dystrophic calcifications throughout the visible pancreas.  IMPRESSION: 1. Moderate to large right greater than left layering pleural  effusions with compressive atelectasis. 2. Seven mm right middle lobe lung nodule (series 3, image 38). If the patient is at high risk for bronchogenic carcinoma, follow-up chest CT at 3-6 months is recommended. If the patient is at low risk for bronchogenic carcinoma, follow-up chest CT at 6-12 months is recommended. This recommendation follows the consensus statement: Guidelines for Management of Small Pulmonary Nodules Detected on CT Scans: A Statement from the Fleischner Society as published in Radiology 2005; 237:395-400. 3. Chronic calcific pancreatitis.   Electronically Signed   By: Augusto Gamble M.D.   On: 11/29/2013 13:54   Dg Pelvis Portable  12/05/2013   CLINICAL DATA:  Postoperative radiographs for left-sided hemiarthroplasty.  EXAM: PORTABLE PELVIS 1-2 VIEWS  COMPARISON:  Left hip radiographs performed earlier today at 11:56 a.m.  FINDINGS: The patient is status post left hip hemiarthroplasty. The prosthesis demonstrates normal alignment, without evidence of loosening. No new fractures are seen. Overlying soft tissue air is noted. The right hip is grossly unremarkable in appearance. The visualized portions of the sacroiliac joints  are grossly unremarkable. The visualized bowel gas pattern is within normal limits.  IMPRESSION: Status post left hip hemiarthroplasty; the prosthesis demonstrates normal alignment, without evidence of loosening. No new fracture seen.   Electronically Signed   By: Roanna Raider M.D.   On: 12/05/2013 00:53   Dg Chest Port 1 View  12/05/2013   CLINICAL DATA:  Check endotracheal tube position.  EXAM: PORTABLE CHEST - 1 VIEW  COMPARISON:  DG CHEST 1V PORT dated 12/04/2013; CT CHEST W/CM dated 11/29/2013; DG CHEST 2 VIEW dated 11/28/2013  FINDINGS: Endotracheal tube terminates approximately 6.2 cm above the carina. Nasogastric tube is followed into the stomach with the tip projecting beyond the inferior margin of the image. Heart size stable. Thoracic aorta is calcified. Bibasilar airspace consolidation with bilateral pleural effusions, stable. There may be mild interstitial prominence as well.  IMPRESSION: 1. Bilateral effusions and bibasilar airspace consolidation. Pneumonia not excluded. 2. Possible mild interstitial prominence suggesting edema.   Electronically Signed   By: Leanna Battles M.D.   On: 12/05/2013 07:20   Dg Chest Portable 1 View  12/04/2013   CLINICAL DATA:  FALL HIP INJURY  EXAM: PORTABLE CHEST - 1 VIEW  COMPARISON:  CT CHEST W/CM dated 11/29/2013  FINDINGS: Cardiac silhouette is enlarged. A right pleural effusion is appreciated tracking along the lateral chest wall. A smaller left effusion is identified. Areas of increased density projects within the lung bases. There is prominence of the interstitial markings. A wedge-shaped area of increased density extends from the right hilar region and appears to track along the minor minor fissure likely representing fluid. Degenerative changes are appreciated within the shoulders.  IMPRESSION: Interstitial findings likely reflecting pulmonary edema.  Bilateral pleural effusions right greater than left  Likely fluid along the minor fissure  Likely bibasilar  atelectasis.   Electronically Signed   By: Salome Holmes M.D.   On: 12/04/2013 13:53    ASSESSMENT AND PLAN  1. Severe left ventricular dysfunction with ejection fraction of 15% and grade 2 diastolic dysfunction. Recent admission for CHF. He appears to be euvolemic at present .  We will start to restart some of his heart failure medication.  Begin with carvedilol 3.125 mg twice a day. 2. postop left hip hemiarthroplasty 12/04/13  3. postop anemia.  Hemoglobin down to 7.6.  Continue to follow closely.  Consider transfusion.  4. myoclonic jerks possibly secondary to Lyrica which has been stopped.  This morning we do not notice any myoclonic jerks.   Signed, Cassell Clement MD

## 2013-12-07 NOTE — Progress Notes (Signed)
   Subjective: 3 Days Post-Op Procedure(s) (LRB): ARTHROPLASTY BIPOLAR HIP (Left)   Patient reports pain as mild, pain controlled. No events involving the hip. He states that the hip feels stable.  Objective:   VITALS:   Filed Vitals:   12/07/13 0754  BP: 121/61  Pulse: 89  Temp: 99.2 F (37.3 C)  Resp:     Dorsiflexion/Plantar flexion intact Incision: dressing C/D/I No cellulitis present Compartment soft  LABS  Recent Labs  12/05/13 0410 12/06/13 0515 12/07/13 0500  HGB 8.9* 8.7* 7.6*  HCT 24.5* 23.6* 20.6*  WBC 6.4 6.3 7.4  PLT 172 200 204     Recent Labs  12/05/13 0410 12/06/13 0515 12/07/13 0500  NA 127* 126* 125*  K 4.7 4.4 4.1  BUN 26* 20 17  CREATININE 0.77 0.68 0.62  GLUCOSE 150* 88 140*     Assessment/Plan: 3 Days Post-Op Procedure(s) (LRB): ARTHROPLASTY BIPOLAR HIP (Left)   Up with therapy when ready/able, WBAT on the left hip  Ortho recommendations: Lovenox for anticoagualtion (Rx on chart), unless other medically indicated. Norco for pain management (Rx written). MiraLax and Colace for constipation Iron 325 mg tid for 2-3 weeks  WBAT on the left hip Dressing to remain in place until follow in clinic in 2 weeks. Dressing is waterproof and may shower with it in place. Follow up in 2 weeks at [facility]. Follow up with OLIN,Jaaliyah Lucatero D in 2 weeks.  Contact information:  [facility] [address], [address] [place] [address] [phone]       Anastasio Auerbach. Daril Warga   PAC  12/07/2013, 8:03 AM

## 2013-12-07 NOTE — Evaluation (Signed)
Clinical/Bedside Swallow Evaluation Patient Details  Name: [name] [name] MRN: [id number] Date of Birth: 08/29/1941  Today's Date: 12/07/2013 Time: 1000-1014 SLP Time Calculation (min): 14 min  Past Medical History:  Past Medical History  Diagnosis Date  . Pancreatic disorder   . Cardiomyopathy, dilated   . Hypertension 11/29/2013  . Protein-calorie malnutrition, severe 12/01/2013  . High cholesterol 11/2013  . Chronic combined systolic and diastolic CHF (congestive heart failure):  Echo 11/29/13:  EF 15%, Grade two diastolic disfunction. 11/28/2013  . Type II diabetes mellitus   . Lung nodule     "incidental, >3 mm and < 78mm" (12/04/2013)  . Pleural effusion   . Femoral neck fracture 12/04/2013    "left; fell at nursing home today"    Past Surgical History:  Past Surgical History  Procedure Laterality Date  . Tonsillectomy    . Hip arthroplasty Left 12/04/2013    Procedure: ARTHROPLASTY BIPOLAR HIP;  Surgeon: Shelda Pal, MD;  Location: [facility] OR;  Service: Orthopedics;  Laterality: Left;   HPI:  73 year old male with extensive medical history was in rehab facility, fell and broke his hip. Taken to the OR 4/7 for fixation. Remained intubated 4/7-4/9.    Assessment / Plan / Recommendation Clinical Impression  Pt demosntrates swallow function WNL. No evidence of aspiration, voice clear and chough is strong. Recommend regular diet and thin liquids. No SLP f/u needed.     Aspiration Risk  Mild    Diet Recommendation Regular;Thin liquid   Liquid Administration via: Cup;Straw Medication Administration: Whole meds with liquid Supervision: Patient able to self feed Postural Changes and/or Swallow Maneuvers: Seated upright 90 degrees    Other  Recommendations Oral Care Recommendations: Oral care BID   Follow Up Recommendations  None    Frequency and Duration        Pertinent Vitals/Pain NA    SLP Swallow Goals     Swallow Study Prior Functional Status       General HPI: 73  year old male with extensive medical history was in rehab facility, fell and broke his hip. Taken to the OR 4/7 for fixation. Remained intubated 4/7-4/9.  Type of Study: Bedside swallow evaluation Previous Swallow Assessment: none Diet Prior to this Study: NPO Temperature Spikes Noted: No Respiratory Status: Nasal cannula History of Recent Intubation: Yes Length of Intubations (days): 3 days Date extubated: 12/06/13 Behavior/Cognition: Alert;Requires cueing;Confused Oral Cavity - Dentition: Adequate natural dentition Self-Feeding Abilities: Able to feed self Patient Positioning: Upright in bed Baseline Vocal Quality: Clear Volitional Cough: Strong Volitional Swallow: Able to elicit    Oral/Motor/Sensory Function Overall Oral Motor/Sensory Function: Appears within functional limits for tasks assessed   Ice Chips     Thin Liquid Thin Liquid: Within functional limits Presentation: Cup;Straw    Nectar Thick Nectar Thick Liquid: Not tested   Honey Thick Honey Thick Liquid: Not tested   Puree Puree: Within functional limits   Solid   GO    Solid: Within functional limits      [facility], [place] CCC-SLP [phone]  Riley Nearing Marti Acebo 12/07/2013,10:31 AM

## 2013-12-08 DIAGNOSIS — G2401 Drug induced subacute dyskinesia: Secondary | ICD-10-CM | POA: Diagnosis present

## 2013-12-08 DIAGNOSIS — E1149 Type 2 diabetes mellitus with other diabetic neurological complication: Secondary | ICD-10-CM

## 2013-12-08 DIAGNOSIS — E1142 Type 2 diabetes mellitus with diabetic polyneuropathy: Secondary | ICD-10-CM

## 2013-12-08 DIAGNOSIS — Z720 Tobacco use: Secondary | ICD-10-CM | POA: Diagnosis present

## 2013-12-08 LAB — BASIC METABOLIC PANEL
BUN: 24 mg/dL — AB (ref 6–23)
CALCIUM: 8.3 mg/dL — AB (ref 8.4–10.5)
CO2: 26 mEq/L (ref 19–32)
Chloride: 94 mEq/L — ABNORMAL LOW (ref 96–112)
Creatinine, Ser: 0.79 mg/dL (ref 0.50–1.35)
GFR calc Af Amer: >90 mL/min (ref >90)
GFR, EST NON AFRICAN AMERICAN: 89 mL/min — AB (ref >90)
Glucose, Bld: 130 mg/dL — ABNORMAL HIGH (ref 70–99)
Potassium: 4.7 mEq/L (ref 3.7–5.3)
Sodium: 131 mEq/L — ABNORMAL LOW (ref 137–147)

## 2013-12-08 LAB — CBC
HEMATOCRIT: 22.8 % — AB (ref 39.0–52.0)
HEMOGLOBIN: 8.2 g/dL — AB (ref 13.0–17.0)
MCH: 30.8 pg (ref 26.0–34.0)
MCHC: 36 g/dL (ref 30.0–36.0)
MCV: 85.7 fL (ref 78.0–100.0)
Platelets: 227 10*3/uL (ref 150–400)
RBC: 2.66 MIL/uL — ABNORMAL LOW (ref 4.22–5.81)
RDW: 12.8 % (ref 11.5–15.5)
WBC: 9.2 10*3/uL (ref 4.0–10.5)

## 2013-12-08 LAB — GLUCOSE, CAPILLARY
GLUCOSE-CAPILLARY: 151 mg/dL — AB (ref 70–99)
Glucose-Capillary: 143 mg/dL — ABNORMAL HIGH (ref 70–99)
Glucose-Capillary: 261 mg/dL — ABNORMAL HIGH (ref 70–99)

## 2013-12-08 LAB — PRO B NATRIURETIC PEPTIDE: Pro B Natriuretic peptide (BNP): 16727 pg/mL — ABNORMAL HIGH (ref 0–125)

## 2013-12-08 LAB — PHOSPHORUS: Phosphorus: 2.6 mg/dL (ref 2.3–4.6)

## 2013-12-08 LAB — MAGNESIUM: Magnesium: 2.1 mg/dL (ref 1.5–2.5)

## 2013-12-08 MED ORDER — OXYCODONE HCL 5 MG PO TABS
5.0000 mg | ORAL_TABLET | Freq: Four times a day (QID) | ORAL | Status: DC | PRN
  Administered 2013-12-09: 5 mg via ORAL
  Filled 2013-12-08: qty 1

## 2013-12-08 MED ORDER — FUROSEMIDE 20 MG PO TABS
20.0000 mg | ORAL_TABLET | Freq: Every day | ORAL | Status: DC
  Administered 2013-12-09 – 2013-12-10 (×2): 20 mg via ORAL
  Filled 2013-12-08 (×2): qty 1

## 2013-12-08 MED ORDER — NICOTINE 21 MG/24HR TD PT24
21.0000 mg | MEDICATED_PATCH | Freq: Every day | TRANSDERMAL | Status: DC
  Administered 2013-12-08 – 2013-12-10 (×3): 21 mg via TRANSDERMAL
  Filled 2013-12-08 (×4): qty 1

## 2013-12-08 MED ORDER — LISINOPRIL 2.5 MG PO TABS
2.5000 mg | ORAL_TABLET | Freq: Every day | ORAL | Status: DC
  Administered 2013-12-08 – 2013-12-10 (×3): 2.5 mg via ORAL
  Filled 2013-12-08 (×3): qty 1

## 2013-12-08 MED ORDER — PREGABALIN 50 MG PO CAPS
50.0000 mg | ORAL_CAPSULE | Freq: Three times a day (TID) | ORAL | Status: DC
  Administered 2013-12-08 – 2013-12-10 (×7): 50 mg via ORAL
  Filled 2013-12-08 (×7): qty 1

## 2013-12-08 NOTE — Progress Notes (Signed)
Physical Therapy Treatment Patient Details Name: [name] [name] MRN: [id number] DOB: 26-Apr-1941 Today's Date: 01/04/14    History of Present Illness Patient just d/c from hospital to SNF.  One night at SNF, and patient had fall the following day resulting in Lt hip fx.  Daughter reports 3 falls in last several months.  Patient with h/o DM, ETOH abuse, dementia, CHF.    PT Comments    Pt slowly progressing with mobility.  Follow Up Recommendations  SNF;Supervision/Assistance - 24 hour     Equipment Recommendations  Rolling walker with 5" wheels    Precautions / Restrictions Precautions Precautions: Fall Precaution Comments: Daughter reports 3 falls recently Restrictions LLE Weight Bearing: Weight bearing as tolerated    Mobilit  Transfers   Equipment used: Rolling walker (2 wheeled) Transfers: Sit to/from Stand Sit to Stand: Mod assist;+2 physical assistance         General transfer comment: cues and assist needed to safely stand from recliner and adhere to hip precautions. Max assist to dependent assit to sit down safely after standing for 2-3 mintues at walker.     Balance Overall balance assessment: Needs assistance      Standing balance support: During functional activity   Standing balance comment: at RW with min assist initially progressing to max assist due to pt with increasing posterior lean. standing at walker for 2-3 minutes total.       Cognition Arousal/Alertness: Awake/alert Behavior During Therapy: Flat affect Overall Cognitive Status: No family/caregiver present to determine baseline cognitive functioning Area of Impairment: Orientation;Memory;Following commands;Safety/judgement;Problem solving;Awareness Orientation Level: Disoriented to;Place;Time;Situation Current Attention Level: Sustained Memory: Decreased recall of precautions Following Commands: Follows one step commands inconsistently;Follows one step commands with increased  time Safety/Judgement: Decreased awareness of safety;Decreased awareness of deficits   Problem Solving: Slow processing;Decreased initiation;Requires verbal cues;Requires tactile cues General Comments: pt with history of dementia; family reports it has been worsening     Exercises General Exercises - Lower Extremity Ankle Circles/Pumps: AROM;Both;10 reps;Seated Quad Sets: AROM;Strengthening;Left;10 reps;Seated Long Arc Quad: AROM;Strengthening;Left;10 reps;Seated Heel Slides: AAROM;Strengthening;Left;10 reps;Seated Hip ABduction/ADduction: AAROM;Strengthening;Left;10 reps;Seated        PT Goals (current goals can now be found in the care plan section) Acute Rehab PT Goals Patient Stated Goal: None stated  Time For Goal Achievement: 12/21/13 Potential to Achieve Goals: Fair Progress towards PT goals: Progressing toward goals    Frequency  Min 6X/week    PT Plan Current plan remains appropriate    End of Session Equipment Utilized During Treatment: Gait belt Activity Tolerance: Patient tolerated treatment well;Patient limited by fatigue Patient left: in chair;with call bell/phone within reach     Time: 1146-1203 PT Time Calculation (min): 17 min  Charges:  $Therapeutic Activity: 8-22 mins                    G Codes:      Sallyanne Kuster 01-04-14, 1:09 PM.  Sallyanne Kuster, PTA Office- [phone]

## 2013-12-08 NOTE — Progress Notes (Signed)
Patient Name: [name] EmoryClyde Burak Date of Encounter: 12/08/2013     Principal Problem:   Hip fracture, left Active Problems:   Chronic combined systolic and diastolic CHF (congestive heart failure):  Echo 11/29/13:  EF 15%, Grade two diastolic disfunction.   Type II or unspecified type diabetes mellitus without mention of complication, not stated as uncontrolled   Incidental lung nodule, > 3mm and < 8mm   Hypotension   Preoperative cardiovascular examination   Acute respiratory failure    SUBJECTIVE  The patient denies dyspnea or chest pain CURRENT MEDS . antiseptic oral rinse  15 mL Mouth Rinse QID  . carvedilol  3.125 mg Oral BID WC  . chlorhexidine  15 mL Mouth Rinse BID  . docusate sodium  100 mg Oral BID  . enoxaparin (LOVENOX) injection  40 mg Subcutaneous Q24H  . feeding supplement (ENSURE COMPLETE)  237 mL Oral BID BM  . ferrous sulfate  325 mg Oral TID PC  . furosemide  40 mg Intravenous Once  . insulin aspart  0-5 Units Subcutaneous QHS  . insulin aspart  0-9 Units Subcutaneous TID WC  . lipase/protease/amylase  1 capsule Oral TID WC  . multivitamin with minerals  1 tablet Oral Daily  . pantoprazole sodium  40 mg Per Tube Q1200  . simvastatin  20 mg Oral q1800    OBJECTIVE  Filed Vitals:   12/07/13 1622 12/07/13 2220 12/08/13 0039 12/08/13 0435  BP: 111/58 102/49 102/79 117/52  Pulse: 77 74 78 82  Temp: 99 F (37.2 C) 98.5 F (36.9 C) 99.1 F (37.3 C) 97.7 F (36.5 C)  TempSrc: Oral Oral Oral Oral  Resp: 16 16 18 18   Height:      Weight:      SpO2: 99% 93% 98% 93%    Intake/Output Summary (Last 24 hours) at 12/08/13 0958 Last data filed at 12/08/13 0835  Gross per 24 hour  Intake  306.5 ml  Output    551 ml  Net -244.5 ml   Filed Weights   12/04/13 1800 12/04/13 2300 12/06/13 0600  Weight: 300 lb 0.7 oz (136.1 kg) 140 lb 3.4 oz (63.6 kg) 132 lb 11.5 oz (60.2 kg)    PHYSICAL EXAM  General: Pleasant, NAD. Neuro: Moves all extremities  spontaneously. HEENT:  Normal  Neck: Supple  Lungs:  Decreased breath sounds at bases Heart: RRR  Abdomen: Soft, non-tender, non-distended Extremities: No edema.  Accessory Clinical Findings  CBC  Recent Labs  12/06/13 0515 12/07/13 0500  WBC 6.3 7.4  HGB 8.7* 7.6*  HCT 23.6* 20.6*  MCV 85.2 84.4  PLT 200 204   Basic Metabolic Panel  Recent Labs  12/06/13 0515 12/07/13 0500  NA 126* 125*  K 4.4 4.1  CL 90* 88*  CO2 23 24  GLUCOSE 88 140*  BUN 20 17  CREATININE 0.68 0.62  CALCIUM 7.9* 8.0*  MG 1.8 2.2  PHOS 3.5 2.9      Radiology/Studies  Dg Chest 2 View  11/28/2013   CLINICAL DATA:  Fatigue and muscle weakness.  EXAM: CHEST  2 VIEW  COMPARISON:  None.  FINDINGS: Lung volumes are low. There is bibasilar collapse/ consolidation with small bilateral pleural effusions. No evidence for overt pulmonary edema at this time. The cardio pericardial silhouette is enlarged. Bones are diffusely demineralized. Telemetry leads overlie the chest.  IMPRESSION: Bilateral lower lobe collapse/consolidation with small to moderate bilateral pleural effusions.   Electronically Signed   By: Jamison OkaEric  Mansell M.D.  On: 11/28/2013 20:24   Dg Hip Complete Left  12/04/2013   CLINICAL DATA:  Fall  EXAM: LEFT HIP - COMPLETE 2+ VIEW  COMPARISON:  None.  FINDINGS: Fractures of the left femoral neck with some impaction and angulation. Mild displacement. Hip joint appears normal on the left. Chronic healed fracture left ischial healed tuberosity. .  IMPRESSION: Left femoral neck fracture.   Electronically Signed   By: Marlan Palau M.D.   On: 12/04/2013 12:14   Ct Head Wo Contrast  12/06/2013   CLINICAL DATA:  Altered mental status.  EXAM: CT HEAD WITHOUT CONTRAST  TECHNIQUE: Contiguous axial images were obtained from the base of the skull through the vertex without intravenous contrast.  COMPARISON:  None  FINDINGS: Ventricles are normal in configuration. There is ventricular and sulcal enlargement  reflecting moderate atrophy.  There are no parenchymal masses or mass effect. There is no evidence of a cortical infarct. Mild patchy areas of white matter hypoattenuation that are consistent with chronic microvascular ischemic change.  There are no extra-axial masses or abnormal fluid collections.  There is no intracranial hemorrhage.  Visualized sinuses and mastoid air cells are clear. No skull lesion.  IMPRESSION: No acute intracranial abnormalities.  Moderate atrophy and mild chronic microvascular ischemic change.   Electronically Signed   By: Amie Portland M.D.   On: 12/06/2013 14:57   Ct Chest W Contrast  11/29/2013   CLINICAL DATA:  73 year old male with shortness of breath and cough. Recent weight gain and lower extremity swelling. Pleural effusions. Initial encounter.  EXAM: CT CHEST WITH CONTRAST  TECHNIQUE: Multidetector CT imaging of the chest was performed during intravenous contrast administration.  CONTRAST:  41mL OMNIPAQUE IOHEXOL 300 MG/ML  SOLN  COMPARISON:  Chest radiographs 11/28/2013.  FINDINGS: Moderate to large bilateral layering pleural effusions, the right is larger. Simple fluid densitometry suggesting transudate fluid. No pericardial effusion. Cardiomegaly. Widespread atherosclerotic plaque in the aorta. Coronary artery involvement.  Compressive atelectasis in both lungs. There is also a 7 mm right middle lobe lung nodule on series 3, image 38. Major airways are patent.  No acute or suspicious osseous lesion. Mild chronic left lateral tenth rib fracture. Mild scoliosis.  No axillary or mediastinal lymphadenopathy.  Visualized upper abdomen remarkable for confluent dystrophic calcifications throughout the visible pancreas.  IMPRESSION: 1. Moderate to large right greater than left layering pleural effusions with compressive atelectasis. 2. Seven mm right middle lobe lung nodule (series 3, image 38). If the patient is at high risk for bronchogenic carcinoma, follow-up chest CT at 3-6 months  is recommended. If the patient is at low risk for bronchogenic carcinoma, follow-up chest CT at 6-12 months is recommended. This recommendation follows the consensus statement: Guidelines for Management of Small Pulmonary Nodules Detected on CT Scans: A Statement from the Fleischner Society as published in Radiology 2005; 237:395-400. 3. Chronic calcific pancreatitis.   Electronically Signed   By: Augusto Gamble M.D.   On: 11/29/2013 13:54   Dg Pelvis Portable  12/05/2013   CLINICAL DATA:  Postoperative radiographs for left-sided hemiarthroplasty.  EXAM: PORTABLE PELVIS 1-2 VIEWS  COMPARISON:  Left hip radiographs performed earlier today at 11:56 a.m.  FINDINGS: The patient is status post left hip hemiarthroplasty. The prosthesis demonstrates normal alignment, without evidence of loosening. No new fractures are seen. Overlying soft tissue air is noted. The right hip is grossly unremarkable in appearance. The visualized portions of the sacroiliac joints are grossly unremarkable. The visualized bowel gas pattern is within normal  limits.  IMPRESSION: Status post left hip hemiarthroplasty; the prosthesis demonstrates normal alignment, without evidence of loosening. No new fracture seen.   Electronically Signed   By: Roanna Raider M.D.   On: 12/05/2013 00:53   Dg Chest Port 1 View  12/05/2013   CLINICAL DATA:  Check endotracheal tube position.  EXAM: PORTABLE CHEST - 1 VIEW  COMPARISON:  DG CHEST 1V PORT dated 12/04/2013; CT CHEST W/CM dated 11/29/2013; DG CHEST 2 VIEW dated 11/28/2013  FINDINGS: Endotracheal tube terminates approximately 6.2 cm above the carina. Nasogastric tube is followed into the stomach with the tip projecting beyond the inferior margin of the image. Heart size stable. Thoracic aorta is calcified. Bibasilar airspace consolidation with bilateral pleural effusions, stable. There may be mild interstitial prominence as well.  IMPRESSION: 1. Bilateral effusions and bibasilar airspace consolidation. Pneumonia  not excluded. 2. Possible mild interstitial prominence suggesting edema.   Electronically Signed   By: Leanna Battles M.D.   On: 12/05/2013 07:20   Dg Chest Portable 1 View  12/04/2013   CLINICAL DATA:  FALL HIP INJURY  EXAM: PORTABLE CHEST - 1 VIEW  COMPARISON:  CT CHEST W/CM dated 11/29/2013  FINDINGS: Cardiac silhouette is enlarged. A right pleural effusion is appreciated tracking along the lateral chest wall. A smaller left effusion is identified. Areas of increased density projects within the lung bases. There is prominence of the interstitial markings. A wedge-shaped area of increased density extends from the right hilar region and appears to track along the minor minor fissure likely representing fluid. Degenerative changes are appreciated within the shoulders.  IMPRESSION: Interstitial findings likely reflecting pulmonary edema.  Bilateral pleural effusions right greater than left  Likely fluid along the minor fissure  Likely bibasilar atelectasis.   Electronically Signed   By: Salome Holmes M.D.   On: 12/04/2013 13:53    ASSESSMENT AND PLAN  1. Severe left ventricular dysfunction with ejection fraction of 15% and grade 2 diastolic dysfunction. He appears to be euvolemic at present .  Plan medical therapy for LV dysfunction as outlined previously given dementia. Continue coreg; add lisinopril 2.5 mg daily; lasix 20 mg daily; titrate meds as outpatient. 2. postop left hip hemiarthroplasty 12/04/13; per orthopedics 3. postop anemia.  Hemoglobin down to 7.6.  Will leave issue of transfusion to primary care.  We will see again Monday; please call with questions prior to then.  Signed, Lewayne Bunting MD

## 2013-12-08 NOTE — Progress Notes (Signed)
At 16:30, walked passed patient's room and found him on floor. Patient stated he was trying to get to bathroom from chair. VSS. Doctor notified- no new orders. Daughter notified. No injury noted. Re-oriented patient and high fall risk precautions in place- door open, bed alarm, yellow socks, etc.

## 2013-12-08 NOTE — Progress Notes (Signed)
[facility] PROGRESS NOTE  [name] [name] [id number]:[id number] DOB: [birth year] DOA: 12/04/2013 PCP: Verlon Au, MD  Assessment/Plan: Principal Problem:   Hip fracture, left: Status post bipolar left hip arthroplasty Active Problems:   Chronic combined systolic and diastolic CHF (congestive heart failure):  Echo 11/29/13:  EF 15%, Grade two diastolic disfunction.: Appreciate cardiology help. Patient looks to be euvolemic. Continue to monitor daily weights. Continue Coreg and ACE inhibitor plus diuretic    Type 2 diabetes, uncontrolled, with neuropathy: Restarted back on Lyrica of her neuropathy. Continue to monitor CBGs. Stable    Incidental lung nodule, > 2mm and < 62mm    Protein-calorie malnutrition, severe: Patient needs criteria in the setting of context of chronic illness. Continue Ensure twice a day. Patient did well a swallow evaluation    Hypotension  Vascular dementia: Some cognitive decline. Will make sure to discuss with skilled nursing facility for better care plan upon discharge     Acute respiratory failure : Stable status post intubation on 4/7 prior surgery and extubated 4/9.    Tardive dyskinesia: Felt to be secondary to Reglan which has been discontinued permanently. Symptoms improving   Tobacco abuse: Resume a nicotine patch  Code Status: DO NOT RESUSCITATE  Family Communication: Discussed with daughter at the bedside  Disposition Plan: Skilled nursing likely Monday   Consultants:  [facility]lin--orthopedic surgery  [facility]ou[facility]-critical care  [place]ackbill-cardiology  Procedures:  Status post left hip bipolar arthroplasty done 4/7  Right radial arterial line 4/7-4/9  Antibiotics:  Ancef postop 4/7 times one dose  HPI/Subjective: Patient doing okay. No complaints. No pain.  Objective: Filed Vitals:   12/08/13 0435  BP: 117/52  Pulse: 82  Temp: 97.7 F (36.5 C)  Resp: 18    Intake/Output Summary (Last 24 hours) at 12/08/13 1630 Last data filed at  12/08/13 0835  Gross per 24 hour  Intake      0 ml  Output    551 ml  Net   -551 ml   Filed Weights   12/04/13 1800 12/04/13 2300 12/06/13 0600  Weight: 136.1 kg (300 lb 0.7 oz) 63.6 kg (140 lb 3.4 oz) 60.2 kg (132 lb 11.5 oz)    Exam:   General:  Alert and oriented x2, no acute distress  Cardiovascular: Regular rate and rhythm, S1-S2, 2/6 systolic ejection murmur  Respiratory: Clear to auscultation bilaterally  Abdomen: Soft, nontender, nondistended, positive bowel sounds  Musculoskeletal: No clubbing or cyanosis or edema   Data Reviewed: Basic Metabolic Panel:  Recent Labs Lab 12/04/13 1217 12/04/13 1947 12/04/13 2246 12/05/13 0410 12/06/13 0515 12/07/13 0500  NA 124* 125* 124* 127* 126* 125*  K 5.9* 5.7* 4.8 4.7 4.4 4.1  CL 89* 89*  --  90* 90* 88*  CO2 25 23  --  24 23 24   GLUCOSE 145* 122*  --  150* 88 140*  BUN 24* 27*  --  26* 20 17  CREATININE 0.82 0.80  --  0.77 0.68 0.62  CALCIUM 7.9* 8.5  --  8.0* 7.9* 8.0*  MG  --   --   --   --  1.8 2.2  PHOS  --   --   --   --  3.5 2.9   Liver Function Tests: No results found for this basename: AST, ALT, ALKPHOS, BILITOT, PROT, ALBUMIN,  in the last 168 hours No results found for this basename: LIPASE, AMYLASE,  in the last 168 hours No results found for this basename: AMMONIA,  in the last  168 hours CBC:  Recent Labs Lab 12/04/13 1217 12/04/13 2246 12/05/13 0410 12/06/13 0515 12/07/13 0500  WBC 5.3  --  6.4 6.3 7.4  NEUTROABS 3.8  --   --   --   --   HGB 10.2* 9.2* 8.9* 8.7* 7.6*  HCT 28.6* 27.0* 24.5* 23.6* 20.6*  MCV 87.2  --  85.4 85.2 84.4  PLT 200  --  172 200 204   Cardiac Enzymes:  Recent Labs Lab 12/04/13 1449  TROPONINI <0.30   BNP (last 3 results)  Recent Labs  12/01/13 0328 12/04/13 1449 12/08/13 1310  PROBNP 9675.0* 5148.0* 16727.0*   CBG:  Recent Labs Lab 12/07/13 0759 12/07/13 1405 12/07/13 1642 12/07/13 2259 12/08/13 0649  GLUCAP 145* 210* 222* 151* 143*     Recent Results (from the past 240 hour(s))  CLOSTRIDIUM DIFFICILE BY PCR     Status: None   Collection Time    11/29/13  4:21 PM      Result Value Ref Range Status   C difficile by pcr NEGATIVE  NEGATIVE Final  MRSA PCR SCREENING     Status: None   Collection Time    12/04/13  5:59 PM      Result Value Ref Range Status   MRSA by PCR NEGATIVE  NEGATIVE Final   Comment:            The GeneXpert MRSA Assay (FDA     approved for NASAL specimens     only), is one component of a     comprehensive MRSA colonization     surveillance program. It is not     intended to diagnose MRSA     infection nor to guide or     monitor treatment for     MRSA infections.     Studies: No results found.  Scheduled Meds: . antiseptic oral rinse  15 mL Mouth Rinse QID  . carvedilol  3.125 mg Oral BID WC  . chlorhexidine  15 mL Mouth Rinse BID  . docusate sodium  100 mg Oral BID  . enoxaparin (LOVENOX) injection  40 mg Subcutaneous Q24H  . feeding supplement (ENSURE COMPLETE)  237 mL Oral BID BM  . ferrous sulfate  325 mg Oral TID PC  . furosemide  40 mg Intravenous Once  . [START ON 12/09/2013] furosemide  20 mg Oral Daily  . insulin aspart  0-5 Units Subcutaneous QHS  . insulin aspart  0-9 Units Subcutaneous TID WC  . lipase/protease/amylase  1 capsule Oral TID WC  . lisinopril  2.5 mg Oral Daily  . multivitamin with minerals  1 tablet Oral Daily  . nicotine  21 mg Transdermal Daily  . pantoprazole sodium  40 mg Per Tube Q1200  . pregabalin  50 mg Oral TID  . simvastatin  20 mg Oral q1800   Continuous Infusions: . sodium chloride 10 mL/hr at 12/07/13 0981    Principal Problem:   Hip fracture, left Active Problems:   Chronic combined systolic and diastolic CHF (congestive heart failure):  Echo 11/29/13:  EF 15%, Grade two diastolic disfunction.   Type 2 diabetes, uncontrolled, with neuropathy   Incidental lung nodule, > 3mm and < 8mm   Protein-calorie malnutrition, severe    Hypotension   Preoperative cardiovascular examination   Acute respiratory failure   Tardive dyskinesia   Tobacco abuse    Time spent: 25 minutes    [name] [name]  [facility] Pager [phone]. If 7PM-7AM, please contact night-coverage at  [email or web address], password [email or web address] 12/08/2013, 4:30 PM  LOS: 4 days

## 2013-12-09 DIAGNOSIS — F0390 Unspecified dementia without behavioral disturbance: Secondary | ICD-10-CM | POA: Diagnosis present

## 2013-12-09 DIAGNOSIS — D62 Acute posthemorrhagic anemia: Secondary | ICD-10-CM

## 2013-12-09 LAB — BASIC METABOLIC PANEL
BUN: 23 mg/dL (ref 6–23)
CHLORIDE: 93 meq/L — AB (ref 96–112)
CO2: 25 meq/L (ref 19–32)
Calcium: 8.5 mg/dL (ref 8.4–10.5)
Creatinine, Ser: 0.67 mg/dL (ref 0.50–1.35)
GFR calc Af Amer: >90 mL/min (ref >90)
GLUCOSE: 189 mg/dL — AB (ref 70–99)
POTASSIUM: 4.7 meq/L (ref 3.7–5.3)
Sodium: 130 mEq/L — ABNORMAL LOW (ref 137–147)

## 2013-12-09 LAB — GLUCOSE, CAPILLARY
GLUCOSE-CAPILLARY: 113 mg/dL — AB (ref 70–99)
GLUCOSE-CAPILLARY: 138 mg/dL — AB (ref 70–99)
GLUCOSE-CAPILLARY: 193 mg/dL — AB (ref 70–99)
GLUCOSE-CAPILLARY: 216 mg/dL — AB (ref 70–99)
Glucose-Capillary: 226 mg/dL — ABNORMAL HIGH (ref 70–99)
Glucose-Capillary: 209 mg/dL — ABNORMAL HIGH (ref 70–99)

## 2013-12-09 MED ORDER — FERROUS SULFATE 325 (65 FE) MG PO TABS: 325.0000 mg | ORAL_TABLET | Freq: Every day | ORAL | Status: AC

## 2013-12-09 MED ORDER — POLYETHYLENE GLYCOL 3350 17 G PO PACK: 17.0000 g | PACK | Freq: Every day | ORAL | Status: AC | PRN

## 2013-12-09 MED ORDER — ENSURE COMPLETE PO LIQD: 237.0000 mL | Freq: Two times a day (BID) | ORAL | Status: AC

## 2013-12-09 MED ORDER — FLUOXETINE HCL 20 MG PO CAPS
20.0000 mg | ORAL_CAPSULE | Freq: Every day | ORAL | Status: DC
  Administered 2013-12-09 – 2013-12-10 (×2): 20 mg via ORAL
  Filled 2013-12-09 (×2): qty 1

## 2013-12-09 MED ORDER — LISINOPRIL 5 MG PO TABS: 2.5000 mg | ORAL_TABLET | Freq: Every day | ORAL | Status: AC

## 2013-12-09 NOTE — Progress Notes (Signed)
[facility] PROGRESS NOTE  [name] [name] [id number]:[id number] DOB: 1941/05/14 DOA: 12/04/2013 PCP: Verlon Au, MD  Assessment/Plan: Principal Problem:   Hip fracture, left: Status post bipolar left hip arthroplasty Active Problems:   Chronic combined systolic and diastolic CHF (congestive heart failure):  Echo 11/29/13:  EF 15%, Grade two diastolic disfunction.: Appreciate cardiology help. Patient looks to be euvolemic. Continue to monitor daily weights. Continue Coreg and ACE inhibitor plus diuretic    Type 2 diabetes, uncontrolled, with neuropathy: Restarted back on Lyrica of her neuropathy. Continue to monitor CBGs. Stable. Resume Glucotrol and discharge    Incidental lung nodule, > 28mm and < 11mm    Protein-calorie malnutrition, severe: Patient needs criteria in the setting of context of chronic illness. Continue Ensure twice a day. Patient did well a swallow evaluation    Hypotension: ACE inhibitor restarted back at lower dose  Vascular dementia: Some cognitive decline. Family would like to look at memory care units. Also consult palliative medicine for long-term goals of therapy. Family to avoid future admissions to the hospital     Acute respiratory failure : Stable status post intubation on 4/7 prior surgery and extubated 4/9.    Tardive dyskinesia: Felt to be secondary to Reglan which has been discontinued permanently. Symptoms improving   Tobacco abuse: Resume a nicotine patch  Code Status: DO NOT RESUSCITATE  Family Communication: Discussed with daughter at the bedside  Disposition Plan: Skilled nursing likely Monday   Consultants:  [facility]lin--orthopedic surgery  [facility]-critical care  [facility]-cardiology  Palliative medicine  Procedures:  Status post left hip bipolar arthroplasty done 4/7  Right radial arterial line 4/7-4/9  Antibiotics:  Ancef postop 4/7 times one dose  HPI/Subjective: Patient doing okay. No complaints.  Objective: Filed Vitals:    12/09/13 1335  BP: 112/59  Pulse: 76  Temp: 98.3 F (36.8 C)  Resp: 18    Intake/Output Summary (Last 24 hours) at 12/09/13 1710 Last data filed at 12/09/13 1600  Gross per 24 hour  Intake    840 ml  Output      0 ml  Net    840 ml   Filed Weights   12/04/13 2300 12/06/13 0600 12/08/13 2300  Weight: 63.6 kg (140 lb 3.4 oz) 60.2 kg (132 lb 11.5 oz) 60.3 kg (132 lb 15 oz)    Exam:   General:  Alert and oriented x2, no acute distress  Cardiovascular: Regular rate and rhythm, S1-S2, 2/6 systolic ejection murmur  Respiratory: Clear to auscultation bilaterally  Abdomen: Soft, nontender, nondistended, positive bowel sounds  Musculoskeletal: No clubbing or cyanosis or edema   Data Reviewed: Basic Metabolic Panel:  Recent Labs Lab 12/05/13 0410 12/06/13 0515 12/07/13 0500 12/08/13 2107 12/09/13 0640  NA 127* 126* 125* 131* 130*  K 4.7 4.4 4.1 4.7 4.7  CL 90* 90* 88* 94* 93*  CO2 24 23 24 26 25   GLUCOSE 150* 88 140* 130* 189*  BUN 26* 20 17 24* 23  CREATININE 0.77 0.68 0.62 0.79 0.67  CALCIUM 8.0* 7.9* 8.0* 8.3* 8.5  MG  --  1.8 2.2 2.1  --   PHOS  --  3.5 2.9 2.6  --    Liver Function Tests: No results found for this basename: AST, ALT, ALKPHOS, BILITOT, PROT, ALBUMIN,  in the last 168 hours No results found for this basename: LIPASE, AMYLASE,  in the last 168 hours No results found for this basename: AMMONIA,  in the last 168 hours CBC:  Recent Labs Lab 12/04/13  1217 12/04/13 2246 12/05/13 0410 12/06/13 0515 12/07/13 0500 12/08/13 2107  WBC 5.3  --  6.4 6.3 7.4 9.2  NEUTROABS 3.8  --   --   --   --   --   HGB 10.2* 9.2* 8.9* 8.7* 7.6* 8.2*  HCT 28.6* 27.0* 24.5* 23.6* 20.6* 22.8*  MCV 87.2  --  85.4 85.2 84.4 85.7  PLT 200  --  172 200 204 227   Cardiac Enzymes:  Recent Labs Lab 12/04/13 1449  TROPONINI <0.30   BNP (last 3 results)  Recent Labs  12/01/13 0328 12/04/13 1449 12/08/13 1310  PROBNP 9675.0* 5148.0* 16727.0*    CBG:  Recent Labs Lab 12/08/13 1318 12/08/13 1729 12/09/13 0048 12/09/13 0633 12/09/13 1233  GLUCAP 216* 261* 138* 193* 113*    Recent Results (from the past 240 hour(s))  MRSA PCR SCREENING     Status: None   Collection Time    12/04/13  5:59 PM      Result Value Ref Range Status   MRSA by PCR NEGATIVE  NEGATIVE Final   Comment:            The GeneXpert MRSA Assay (FDA     approved for NASAL specimens     only), is one component of a     comprehensive MRSA colonization     surveillance program. It is not     intended to diagnose MRSA     infection nor to guide or     monitor treatment for     MRSA infections.     Studies: No results found.  Scheduled Meds: . antiseptic oral rinse  15 mL Mouth Rinse QID  . carvedilol  3.125 mg Oral BID WC  . chlorhexidine  15 mL Mouth Rinse BID  . docusate sodium  100 mg Oral BID  . enoxaparin (LOVENOX) injection  40 mg Subcutaneous Q24H  . feeding supplement (ENSURE COMPLETE)  237 mL Oral BID BM  . ferrous sulfate  325 mg Oral TID PC  . FLUoxetine  20 mg Oral Daily  . furosemide  40 mg Intravenous Once  . furosemide  20 mg Oral Daily  . insulin aspart  0-5 Units Subcutaneous QHS  . insulin aspart  0-9 Units Subcutaneous TID WC  . lipase/protease/amylase  1 capsule Oral TID WC  . lisinopril  2.5 mg Oral Daily  . multivitamin with minerals  1 tablet Oral Daily  . nicotine  21 mg Transdermal Daily  . pantoprazole sodium  40 mg Per Tube Q1200  . pregabalin  50 mg Oral TID  . simvastatin  20 mg Oral q1800   Continuous Infusions: . sodium chloride 10 mL/hr at 12/07/13 [id number]    Principal Problem:   Hip fracture, left Active Problems:   Chronic combined systolic and diastolic CHF (congestive heart failure):  Echo 11/29/13:  EF 15%, Grade two diastolic disfunction.   Type 2 diabetes, uncontrolled, with neuropathy   Incidental lung nodule, > 3mm and < 8mm   Protein-calorie malnutrition, severe   Hypotension   Preoperative  cardiovascular examination   Acute respiratory failure   Tardive dyskinesia   Tobacco abuse    Time spent: 25 minutes    Eletha Culbertson Mordecai RasmussenK [employer]  [facility] Pager [phone]. If 7PM-7AM, please contact night-coverage at [email or web address], password [email or web address] 12/09/2013, 5:10 PM  LOS: 5 days

## 2013-12-09 NOTE — Progress Notes (Signed)
Dr. Rito Ehrlich aware of pt removing IV assess.   Instructed not to replace IV assess at this time.

## 2013-12-10 ENCOUNTER — Encounter: Payer: Medicare Other | Admitting: Cardiology

## 2013-12-10 DIAGNOSIS — Z66 Do not resuscitate: Secondary | ICD-10-CM

## 2013-12-10 DIAGNOSIS — Z515 Encounter for palliative care: Secondary | ICD-10-CM

## 2013-12-10 LAB — GLUCOSE, CAPILLARY
GLUCOSE-CAPILLARY: 59 mg/dL — AB (ref 70–99)
GLUCOSE-CAPILLARY: 294 mg/dL — AB (ref 70–99)
Glucose-Capillary: 64 mg/dL — ABNORMAL LOW (ref 70–99)
Glucose-Capillary: 77 mg/dL (ref 70–99)
Glucose-Capillary: 226 mg/dL — ABNORMAL HIGH (ref 70–99)

## 2013-12-10 MED ORDER — INSULIN ASPART 100 UNIT/ML ~~LOC~~ SOLN: 0.0000 [IU] | Freq: Three times a day (TID) | SUBCUTANEOUS | Status: DC

## 2013-12-10 MED ORDER — INSULIN ASPART 100 UNIT/ML ~~LOC~~ SOLN
0.0000 [IU] | Freq: Three times a day (TID) | SUBCUTANEOUS | Status: DC
  Administered 2013-12-10: 3 [IU] via SUBCUTANEOUS
  Administered 2013-12-10: 5 [IU] via SUBCUTANEOUS

## 2013-12-10 NOTE — Progress Notes (Signed)
Hypoglycemic Event  CBG: 59  Treatment: 15 GM carbohydrate snack  Symptoms: None  Follow-up CBG: Time:1910 CBG Result:64  Possible Reasons for Event: Inadequate meal intake  Comments/MD notified:eating well    Louretta Shorten  Remember to initiate Hypoglycemia Order Set & complete

## 2013-12-10 NOTE — Progress Notes (Signed)
Ok per MD for d/c today back to SNF level- [facility].  CSW spoke with patient's daughter Gregary Signs and ex-wife Dottie who agreed to return to facility for continued care. Family and patient met with [facility]- Palliative Care. MOST form and DNR forms completed and will be sent to facility with packet. Family request comfort measures at facility.  CSW notified Rosendo Gros of pateint's return to facility- bed is available. Facility asked to arrange Palliative Care services at facility. EMS to transport. Nursing to call report.  Patient is sitting up at bedside- alert but mostly confused.  Patient's daughter was very tearful this afternoon as she stated that she and her father had a very emotional discussion earlier and he was mentally clear; she felt that this was healing for both of them.  CSW provided support and encouraged daughter to express her feelings.  Patient is alert, pleasant and talkative- asking questions to CSW and his family; support provided. No further CSW needs identified. CSW signing off.  Lorie Phenix. [place], [place]

## 2013-12-10 NOTE — Progress Notes (Signed)
Physical Therapy Treatment Patient Details Name: [name] [name] MRN: [id number] DOB: 04-04-1941 Today's Date: 12/10/2013    History of Present Illness Patient d/c from hospital to SNF.  One night at SNF, and patient had fall the following day resulting in Lt hip fx.  Daughter reports 3 falls in last several months.  Patient with h/o DM, ETOH abuse, dementia, CHF.    PT Comments    Pt progressing well towards physical therapy goals. Ambulates up to 50 feet with min assist, however significant balance deficits with posterior lean and difficulty following single step commands for turning. Pt will benefit from continued skilled PT in SNF to improve independence with functional mobility.   Follow Up Recommendations  SNF;Supervision/Assistance - 24 hour     Equipment Recommendations  Rolling walker with 5" wheels    Recommendations for Other Services       Precautions / Restrictions Precautions Precautions: Fall Precaution Comments: Daughter reports 3 falls recently Restrictions Weight Bearing Restrictions: Yes LLE Weight Bearing: Weight bearing as tolerated    Mobility  Bed Mobility Overal bed mobility: Needs Assistance Bed Mobility: Supine to Sit     Supine to sit: Min assist;HOB elevated     General bed mobility comments: Min assist with bed mobility for supine>sit with HOB elevated. Required frequent verbal cues for technique and min assist for LEs off of bed while maintaining precautions  Transfers Overall transfer level: Needs assistance Equipment used: Rolling walker (2 wheeled) Transfers: Sit to/from Stand Sit to Stand: Min assist;+2 physical assistance         General transfer comment: Min assist +2 for sit>stand. Pt with significant posterior lean and decreased control when performing stand>sit. Cues to straighten knees for upright stance and to bring pelvis forward  Ambulation/Gait Ambulation/Gait assistance: Min assist Ambulation Distance (Feet): 50  Feet Assistive device: Rolling walker (2 wheeled) Gait Pattern/deviations: Step-through pattern;Antalgic;Leaning posteriorly;Drifts right/left     General Gait Details: Pt with decreased balance showing posterior lean at times requiring min assist to correct balance. Progressed with ambulation distance up to 50 feet, however pt has difficulty following commands for turns and tends to run into objects if not correct by redirecting RW from PT. Drifts to L occasionally.   Stairs            Wheelchair Mobility    Modified Rankin (Stroke Patients Only)       Balance                                    Cognition Arousal/Alertness: Awake/alert Behavior During Therapy: Flat affect Overall Cognitive Status: No family/caregiver present to determine baseline cognitive functioning Area of Impairment: Memory;Problem solving;Following commands     Memory: Decreased recall of precautions Following Commands: Follows one step commands inconsistently;Follows one step commands with increased time     Problem Solving: Slow processing;Difficulty sequencing General Comments: Pt has difficulty with turns, more so turns to the Left even with simple commands    Exercises General Exercises - Lower Extremity Ankle Circles/Pumps: AROM;Both;10 reps;Seated    General Comments        Pertinent Vitals/Pain Pt states he is in no pain currently Pt repositioned in chair for comfort     Home Living                      Prior Function  PT Goals (current goals can now be found in the care plan section) Acute Rehab PT Goals PT Goal Formulation: With patient/family Time For Goal Achievement: 12/21/13 Potential to Achieve Goals: Fair Progress towards PT goals: Progressing toward goals    Frequency  Min 6X/week    PT Plan Current plan remains appropriate    Co-evaluation             End of Session Equipment Utilized During Treatment: Gait  belt Activity Tolerance: Patient tolerated treatment well;Patient limited by fatigue Patient left: in chair;with call bell/phone within reach     Time: [id number] PT Time Calculation (min): 14 min  Charges:  $Gait Training: 8-22 mins                    G Codes:     [name] [name], [place] CarolinaPT [phone]   [name] MountLogan S Vallen [name] 12/10/2013, 12:35 PM

## 2013-12-10 NOTE — Progress Notes (Signed)
Hypoglycemic Event  CBG: 64  Treatment: 15 GM carbohydrate snack  Symptoms: None  Follow-up CBG: Time: 0744 CBG Result: 77  Possible Reasons for Event: Unknown  Comments/MD notified: n/a, patient eating breakfast    [name] [name]  Remember to initiate Hypoglycemia Order Set & complete

## 2013-12-10 NOTE — Consult Note (Signed)
Patient WN:UUVOZ Cashen      DOB: 1941-03-07      DGU:[id number]     Consult Note from the Palliative Medicine Team at [facility] Requested by: Dr. [place] Pink     PCP: Bartholome Bill, MD Reason for Consultation: [place] and options.    Phone Number:[phone]  Assessment of patients Current state: Mr. 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 He was currently at [facility] rehab s/p hospitalization for CHF exacerbation when he fell and was found to have left hip fracture repaired on 12/04/13 and remained intubated until 12/06/13. Return to [facility] planned today.  I met today with Linken's daughter, Pincus Large ([phone]), and ex-wife, Dierdre Highman [phone]) both of whom are his HCPOA. Gregary Signs is very tearful in telling her father's history and struggle with alcohol abuse and his recent decline. She tells me that he has significantly declined after his mother's death ~1-2 yr ago when he moved from [place] (lived with his mother) to living temporarily with Gregary Signs. Gregary Signs feels somewhat guilty that she was unable to maintain him at her house but she has a job and 2 young children and a husband. She says that her younger daughter described that he "is like a ghost" in the house. She says how she felt he was embarrassed and isolated himself from his family. Last July he was driving from his apartment in [place] to [place] and wound up in [place]; he called Gregary Signs who went to him and she says he was 107 lbs, having diarrhea, and was admitted for 1 week at [facility] for diabetes. He has not been able to care for himself and manage his diabetes and CHF. They tell me that he has greatly declined and they know he wouldn't want to live this way. They completed MOST form with me: DNR, comfort measures (NO hospital readmission), consider antibiotics, no IV fluids, and  no feeding tube. They do not wish for any measures to prolong his life and suffering. They are also requesting palliative care to follow and help them accomplish these goals and hospice when he is able. They would also like to consider hospice facility (like [facility]) when appropriate as they feel strongly about NO readmission to hospital. They wish for comfort and safety above all else (requesting that pain medication not be withheld d/t low BP) as they want Mr. Tuckey to be comfortable and not suffer.    Goals of Care: 1.  Code Status: DNR   2. Scope of Treatment: MOST complete. Comfort measures.    4. Disposition: [place] rehab with palliative to follow.    3. Symptom Management:   1. Anxiety/Agitation: Recommend low dose Ativan prn.  2. Pain: Oxycodone prn. Robaxin prn.  3. Bowel Regimen: Miralax prn. Colace daily.  4. Nausea/Vomiting: Ondansetron prn.   4. Psychosocial: Emotional support provided to patient and family.   5. Spiritual: Chaplain following.    Patient Documents Completed or Given: Document Given Completed  Advanced Directives Pkt    MOST  yes  DNR  yes  Gone from My Sight    Hard Choices      Brief HPI: 73 yo male with extensive h/o ETOH abuse, combined diastolic/systolic CHF (EF 30%), uncontrolled diabetes, lung nodule, malnutrition, dementia. He was currently at [facility] rehab s/p hospitalization for CHF exacerbation when he fell and was found to have  left hip fracture repaired on 12/04/13 and remained intubated until 12/06/13. Return to [facility] planned today.   ROS: Denies pain, anxiety, constipation, nausea.     PMH:  Past Medical History  Diagnosis Date  . Pancreatic disorder   . Cardiomyopathy, dilated   . Hypertension 11/29/2013  . Protein-calorie malnutrition, severe 12/01/2013  . High cholesterol 11/2013  . Chronic combined systolic and diastolic CHF (congestive heart failure):  Echo 11/29/13:  EF 15%, Grade two diastolic  disfunction. 11/28/2013  . Type II diabetes mellitus   . Lung nodule     "incidental, >3 mm and < 30m" (12/04/2013)  . Pleural effusion   . Femoral neck fracture 12/04/2013    "left; fell at nursing home today"      PSH: Past Surgical History  Procedure Laterality Date  . Tonsillectomy    . Hip arthroplasty Left 12/04/2013    Procedure: ARTHROPLASTY BIPOLAR HIP;  Surgeon: MMauri Pole MD;  [facility]  Service: Orthopedics;  Laterality: Left;   I have reviewed the [place] SH and  If appropriate update it with new information. No Known Allergies Scheduled Meds: . antiseptic oral rinse  15 mL Mouth Rinse QID  . carvedilol  3.125 mg Oral BID WC  . chlorhexidine  15 mL Mouth Rinse BID  . docusate sodium  100 mg Oral BID  . enoxaparin (LOVENOX) injection  40 mg Subcutaneous Q24H  . feeding supplement (ENSURE COMPLETE)  237 mL Oral BID BM  . ferrous sulfate  325 mg Oral TID PC  . FLUoxetine  20 mg Oral Daily  . furosemide  40 mg Intravenous Once  . furosemide  20 mg Oral Daily  . insulin aspart  0-9 Units Subcutaneous TID WC  . lipase/protease/amylase  1 capsule Oral TID WC  . lisinopril  2.5 mg Oral Daily  . multivitamin with minerals  1 tablet Oral Daily  . nicotine  21 mg Transdermal Daily  . pantoprazole sodium  40 mg Per Tube Q1200  . pregabalin  50 mg Oral TID  . simvastatin  20 mg Oral q1800   Continuous Infusions:  PRN Meds:.bisacodyl, menthol-cetylpyridinium, methocarbamol (ROBAXIN) IV, methocarbamol, ondansetron (ZOFRAN) IV, ondansetron, oxyCODONE, phenol, polyethylene glycol    BP 119/60  Pulse 78  Temp(Src) 97.4 F (36.3 C) (Oral)  Resp 18  Ht '5\' 8"'  (1.727 m)  Wt 64.7 kg (142 lb 10.2 oz)  BMI 21.69 kg/m2  SpO2 95%   PPS: 40% at best   Intake/Output Summary (Last 24 hours) at 12/10/13 1511 Last data filed at 12/10/13 0500  Gross per 24 hour  Intake    120 ml  Output      0 ml  Net    120 ml   LBM: 12/10/13                       Stool Softner:  yes  Physical Exam:  General:  NAD, alert, awake HEENT:  [place]/AT, moist mucous membranes without exudate, no JVD Chest:  CTA throughout, no labored breathing, symmetric CVS: RRR, S1 S2, murmur Abdomen: Soft, NT, ND, +BS Ext: MAE, no edema Neuro: Alert, oriented to person, follows commands  Labs: CBC    Component Value Date/Time   WBC 9.2 12/08/2013 2107   RBC 2.66* 12/08/2013 2107   HGB 8.2* 12/08/2013 2107   HCT 22.8* 12/08/2013 2107   PLT 227 12/08/2013 2107   MCV 85.7 12/08/2013 2107   MCH 30.8 12/08/2013 2107  MCHC 36.0 12/08/2013 2107   RDW 12.8 12/08/2013 2107   LYMPHSABS 1.0 12/04/2013 1217   MONOABS 0.4 12/04/2013 1217   EOSABS 0.1 12/04/2013 1217   BASOSABS 0.0 12/04/2013 1217    BMET    Component Value Date/Time   NA 130* 12/09/2013 0640   K 4.7 12/09/2013 0640   CL 93* 12/09/2013 0640   CO2 25 12/09/2013 0640   GLUCOSE 189* 12/09/2013 0640   BUN 23 12/09/2013 0640   CREATININE 0.67 12/09/2013 0640   CALCIUM 8.5 12/09/2013 0640   GFRNONAA >90 12/09/2013 0640   GFRAA >90 12/09/2013 0640    CMP     Component Value Date/Time   NA 130* 12/09/2013 0640   K 4.7 12/09/2013 0640   CL 93* 12/09/2013 0640   CO2 25 12/09/2013 0640   GLUCOSE 189* 12/09/2013 0640   BUN 23 12/09/2013 0640   CREATININE 0.67 12/09/2013 0640   CALCIUM 8.5 12/09/2013 0640   PROT 6.4 03/31/2013 1630   ALBUMIN 3.1* 03/31/2013 1630   AST 40* 03/31/2013 1630   ALT 34 03/31/2013 1630   ALKPHOS 142* 03/31/2013 1630   BILITOT 0.4 03/31/2013 1630   GFRNONAA >90 12/09/2013 0640   GFRAA >90 12/09/2013 0640    Time In Time Out Total Time Spent with Patient Total Overall Time  1320 1450 69mn 925m    Greater than 50%  of this time was spent counseling and coordinating care related to the above assessment and plan.  AlVinie SillNP Palliative Medicine Team Pager # [phone]-F 8a-5p) Team Phone # [phone]/Weekends)

## 2013-12-10 NOTE — Progress Notes (Signed)
Patient Name: [name] [name] Date of Encounter: 12/10/2013   Principal Problem:   Hip fracture, left Active Problems:   Chronic combined systolic and diastolic CHF (congestive heart failure):  Echo 11/29/13:  EF 15%, Grade two diastolic disfunction.   Type 2 diabetes, uncontrolled, with neuropathy   Incidental lung nodule, > 52mm and < 47mm   Protein-calorie malnutrition, severe   Hypotension   Preoperative cardiovascular examination   Acute respiratory failure   Tardive dyskinesia   Tobacco abuse   Dementia without behavioral disturbance   Acute blood loss anemia   SUBJECTIVE  The patient denies dyspnea or chest pain  CURRENT MEDS . antiseptic oral rinse  15 mL Mouth Rinse QID  . carvedilol  3.125 mg Oral BID WC  . chlorhexidine  15 mL Mouth Rinse BID  . docusate sodium  100 mg Oral BID  . enoxaparin (LOVENOX) injection  40 mg Subcutaneous Q24H  . feeding supplement (ENSURE COMPLETE)  237 mL Oral BID BM  . ferrous sulfate  325 mg Oral TID PC  . FLUoxetine  20 mg Oral Daily  . furosemide  40 mg Intravenous Once  . furosemide  20 mg Oral Daily  . insulin aspart  0-9 Units Subcutaneous TID WC  . lipase/protease/amylase  1 capsule Oral TID WC  . lisinopril  2.5 mg Oral Daily  . multivitamin with minerals  1 tablet Oral Daily  . nicotine  21 mg Transdermal Daily  . pantoprazole sodium  40 mg Per Tube Q1200  . pregabalin  50 mg Oral TID  . simvastatin  20 mg Oral q1800   OBJECTIVE  Filed Vitals:   12/10/13 0449 12/10/13 0500 12/10/13 0817 12/10/13 1023  BP: 121/61  105/72 119/60  Pulse: 70  78   Temp: 97.4 F (36.3 C)     TempSrc: Oral     Resp: 18     Height:      Weight:  142 lb 10.2 oz (64.7 kg)    SpO2: 95%       Intake/Output Summary (Last 24 hours) at 12/10/13 1228 Last data filed at 12/10/13 0500  Gross per 24 hour  Intake    600 ml  Output      0 ml  Net    600 ml   Filed Weights   12/06/13 0600 12/08/13 2300 12/10/13 0500  Weight: 132 lb 11.5 oz  (60.2 kg) 132 lb 15 oz (60.3 kg) 142 lb 10.2 oz (64.7 kg)   PHYSICAL EXAM  General: Pleasant, NAD. Neuro: Moves all extremities spontaneously. HEENT:  Normal  Neck: Supple  Lungs:  Decreased breath sounds at bases Heart: RRR  Abdomen: Soft, non-tender, non-distended Extremities: No edema.  Accessory Clinical Findings  CBC  Recent Labs  12/08/13 2107  WBC 9.2  HGB 8.2*  HCT 22.8*  MCV 85.7  PLT 227   Basic Metabolic Panel  Recent Labs  12/08/13 2107 12/09/13 0640  NA 131* 130*  K 4.7 4.7  CL 94* 93*  CO2 26 25  GLUCOSE 130* 189*  BUN 24* 23  CREATININE 0.79 0.67  CALCIUM 8.3* 8.5  MG 2.1  --   PHOS 2.6  --      ASSESSMENT AND PLAN  1. Severe left ventricular dysfunction with ejection fraction of 15% and grade 2 diastolic dysfunction. He appears to be euvolemic at present (despite weight gain).  Plan medical therapy for LV dysfunction as outlined previously given dementia. Continue coreg; add lisinopril 2.5 mg daily; lasix 40 mg  daily; titrate meds as outpatient. Going to outpatient rehab today.  Follow in our clinic.   Signed, Lars MassonKatarina H Rodneisha Bonnet MD

## 2013-12-10 NOTE — Discharge Summary (Signed)
Physician Discharge Summary  [place] UEA:[id number] DOB: [birth year] DOA: 12/04/2013  PCP: Verlon Au, MD  Admit date: 12/04/2013 Discharge date: 12/10/2013  Time spent: 25 minutes  Recommendations for Outpatient Follow-up:  1. Patient be discharged to [facility] skilled nursing facility 2. Patient's Reglan was discontinued secondary to concerns for tardive dyskinesia 3. Patient will be followed by palliative care services there  Discharge Diagnoses:  Principal Problem:   Hip fracture, left Active Problems:   Chronic combined systolic and diastolic CHF (congestive heart failure):  Echo 11/29/13:  EF 15%, Grade two diastolic disfunction.   Type 2 diabetes, uncontrolled, with neuropathy   Incidental lung nodule, > 3mm and < 8mm   Protein-calorie malnutrition, severe   Hypotension   Preoperative cardiovascular examination   Acute respiratory failure   Tardive dyskinesia:    Tobacco abuse:    Dementia without behavioral disturbance   Acute blood loss anemia   Discharge Condition: Improved from initial presentation, being discharged to skilled nursing  Diet recommendation: Heart healthy  Filed Weights   12/06/13 0600 12/08/13 2300 12/10/13 0500  Weight: 60.2 kg (132 lb 11.5 oz) 60.3 kg (132 lb 15 oz) 64.7 kg (142 lb 10.2 oz)    History of present illness:  Patient is a 73 year old white male past medical history of combined systolic and diastolic heart failure with ejection fraction of 15%, diabetes mellitus and mild dementia who was discharged from the hospital one day earlier after CHF exacerbation to a skilled nursing facility. At the skilled nursing facility, the patient sustained a mechanical fall landing on his left side and found to have a left hip fracture. He is admitted to the hospitalist service.  Hospital Course:  Principal Problem:   Hip fracture, left: Patient underwent right hip bipolar arthroplasty done 4/7. Post procedure, he remained intubated under  the critical care service. He was able to be successfully extubated on 4/9. Initially he required pressor support to maintain blood pressure and this was able to be weaned off by 4/9 as well. Cardiology followed and he was able to slowly restart his medications. His been evaluated by physical therapy her recommending return to skilled nursing. There are concerns given his dementia about being able to follow instructions and being a high fall risk. His only other complication was expected acute blood loss anemia after surgery, patient did not require transfusion.  Active Problems:   Chronic combined systolic and diastolic CHF (congestive heart failure):  Echo 11/29/13:  EF 15%, Grade two diastolic dysfunction: See my cardiology before surgery. Felt to be a moderate risk, but he did tolerate surgery well. Initially Lasix and ACE inhibitor held and he was continued on beta blocker. He remained euvolemic during this hospitalization.    Type 2 diabetes, uncontrolled, with neuropathy: A1c on admission was 7.9.Restarted back on Lyrica of her neuropathy. Continue to monitor CBGs. Stable. Resume Glucotrol and discharge    Incidental lung nodule, > 3mm and < 8mm: Noted on x-ray. No further workup given goals of care.    Protein-calorie malnutrition, severe: Patient needs criteria in the setting of context of chronic illness. Continue Ensure twice a day. Patient did well on swallow evaluation    Hypotension: Patient initially presented with low blood pressure of 70. Felt to be secondary to dehydration. Initially, beta blocker, Lasix and ACE inhibitor held and then slowly restarted restarted back at lower dose.    Acute respiratory failure: Secondary to acute on chronic congestive heart failure. Improved after diuresis  Tardive dyskinesia: Discontinued Reglan when patient was noted to have hemiballismus like movements 1-2 days after surgery. This seems to have fixed the problem    Tobacco abuse: Stable, since  previous admission patient has been continued on nicotine patch.    Dementia without behavioral disturbance: Some cognitive deficits. After extensive discussion with the family, it was felt that likely the patient fell at skilled nursing because of inability to remember to ask for assistance when getting out of bed. Family is interested in memory care unit. It also like to avoid future admissions to the hospital. At the previous hospitalization, to discuss with cardiology and did not want to have aggressive cardiac intervention. Palliative care services will follow at skilled nursing for goals of care    Acute blood loss anemia:  Hemoglobin down to as low as 7.6. By 4/11, hemoglobin up to 8.2. No blood transfusion done.  Procedures: Status post left hip bipolar arthroplasty done 4/7  Right radial arterial line 4/7-4/9   Consultations: [facility]--orthopedic surgery  [place]-critical care  [place]-cardiology    Discharge Exam: Filed Vitals:   12/10/13 0817  BP: 105/72  Pulse: 78  Temp:   Resp:     General: Oriented x2 Cardiovascular: Regular rate and rhythm, S1-S2, 2/6 systolic ejection murmur Respiratory: Clear to auscultation bilaterally  Discharge Instructions You were cared for by a hospitalist during your hospital stay. If you have any questions about your discharge medications or the care you received while you were in the hospital after you are discharged, you can call the unit and asked to speak with the hospitalist on call if the hospitalist that took care of you is not available. Once you are discharged, your primary care physician will handle any further medical issues. Please note that NO REFILLS for any discharge medications will be authorized once you are discharged, as it is imperative that you return to your primary care physician (or establish a relationship with a primary care physician if you do not have one) for your aftercare needs so that they can reassess your need for  medications and monitor your lab values.  Discharge Orders   Future Appointments Provider Department Dept Phone   12/17/2013 9:15 AM Julio Sicks, NP [facility] [phone]   Future Orders Complete By Expires   Change dressing  As directed    Diet - low sodium heart healthy  As directed    Walk with assistance  As directed    Weight bearing as tolerated  As directed    Questions:     Laterality:  left   Extremity:  Lower       Medication List    STOP taking these medications       magnesium hydroxide 400 MG/5ML suspension  Commonly known as:  MILK OF MAGNESIA     spironolactone 12.5 mg Tabs tablet  Commonly known as:  ALDACTONE      TAKE these medications       bisacodyl 10 MG suppository  Commonly known as:  DULCOLAX  Place 10 mg rectally daily as needed for moderate constipation.     carvedilol 3.125 MG tablet  Commonly known as:  COREG  Take 1 tablet (3.125 mg total) by mouth 2 (two) times daily with a meal.     enoxaparin 30 MG/0.3ML injection  Commonly known as:  LOVENOX  Inject 0.3 mLs (30 mg total) into the skin daily.     feeding supplement (ENSURE COMPLETE) Liqd  Take 237 mLs by  mouth 2 (two) times daily between meals.     ferrous sulfate 325 (65 FE) MG tablet  Take 1 tablet (325 mg total) by mouth daily with breakfast.     FLUoxetine 20 MG capsule  Commonly known as:  PROZAC  Take 20 mg by mouth daily.     furosemide 20 MG tablet  Commonly known as:  LASIX  Take 1 tablet (20 mg total) by mouth daily.     glipiZIDE 10 MG tablet  Commonly known as:  GLUCOTROL  Take 0.5 tablets (5 mg total) by mouth 2 (two) times daily before a meal.     HYDROcodone-acetaminophen 5-325 MG per tablet  Commonly known as:  NORCO/VICODIN  Take 1-2 tablets by mouth every 6 (six) hours as needed for moderate pain.     lipase/protease/amylase [id number] UNITS Cpep capsule  Commonly known as:  CREON-12/PANCREASE  Take 1 capsule by mouth 3 (three) times daily  with meals.     lisinopril 5 MG tablet  Commonly known as:  PRINIVIL,ZESTRIL  Take 0.5 tablets (2.5 mg total) by mouth daily.     multivitamin with minerals tablet  Take 1 tablet by mouth daily.     nicotine 21 mg/24hr patch  Commonly known as:  NICODERM CQ - dosed in mg/24 hours  Place 21 mg onto the skin daily.     polyethylene glycol packet  Commonly known as:  MIRALAX / GLYCOLAX  Take 17 g by mouth daily as needed for mild constipation.     pregabalin 50 MG capsule  Commonly known as:  LYRICA  Take 1 capsule (50 mg total) by mouth 3 (three) times daily.     simvastatin 20 MG tablet  Commonly known as:  ZOCOR  Take 1 tablet (20 mg total) by mouth daily at 6 PM.     Vitamin D-3 1000 UNITS Caps  Take 1,000 Units by mouth daily.       No Known Allergies     Follow-up Information   Follow up with Shelda PalLIN,MATTHEW D, MD. Schedule an appointment as soon as possible for a visit in 2 weeks.   Specialty:  Orthopedic Surgery   Contact information:   [address] [place] [address] [phone]        The results of significant diagnostics from this hospitalization (including imaging, microbiology, ancillary and laboratory) are listed below for reference.    Significant Diagnostic Studies: Dg Chest 2 View  11/28/2013   CLINICAL DATA:  Fatigue and muscle weakness.  EXAM: CHEST  2 VIEW  COMPARISON:  None.  FINDINGS: Lung volumes are low. There is bibasilar collapse/ consolidation with small bilateral pleural effusions. No evidence for overt pulmonary edema at this time. The cardio pericardial silhouette is enlarged. Bones are diffusely demineralized. Telemetry leads overlie the chest.  IMPRESSION: Bilateral lower lobe collapse/consolidation with small to moderate bilateral pleural effusions.   Electronically Signed   By: Kennith CenterEric  Mansell M.D.   On: 11/28/2013 20:24   Dg Hip Complete Left  12/04/2013   CLINICAL DATA:  Fall  EXAM: LEFT HIP - COMPLETE 2+ VIEW   COMPARISON:  None.  FINDINGS: Fractures of the left femoral neck with some impaction and angulation. Mild displacement. Hip joint appears normal on the left. Chronic healed fracture left ischial healed tuberosity. .  IMPRESSION: Left femoral neck fracture.   Electronically Signed   By: Marlan Palauharles  Clark M.D.   On: 12/04/2013 12:14   Ct Head Wo Contrast  12/06/2013   CLINICAL  DATA:  Altered mental status.  EXAM: CT HEAD WITHOUT CONTRAST  TECHNIQUE: Contiguous axial images were obtained from the base of the skull through the vertex without intravenous contrast.  COMPARISON:  None  FINDINGS: Ventricles are normal in configuration. There is ventricular and sulcal enlargement reflecting moderate atrophy.  There are no parenchymal masses or mass effect. There is no evidence of a cortical infarct. Mild patchy areas of white matter hypoattenuation that are consistent with chronic microvascular ischemic change.  There are no extra-axial masses or abnormal fluid collections.  There is no intracranial hemorrhage.  Visualized sinuses and mastoid air cells are clear. No skull lesion.  IMPRESSION: No acute intracranial abnormalities.  Moderate atrophy and mild chronic microvascular ischemic change.   Electronically Signed   By: Amie Portland M.D.   On: 12/06/2013 14:57   Ct Chest W Contrast  11/29/2013   CLINICAL DATA:  73 year old male with shortness of breath and cough. Recent weight gain and lower extremity swelling. Pleural effusions. Initial encounter.  EXAM: CT CHEST WITH CONTRAST  TECHNIQUE: Multidetector CT imaging of the chest was performed during intravenous contrast administration.  CONTRAST:  23mL OMNIPAQUE IOHEXOL 300 MG/ML  SOLN  COMPARISON:  Chest radiographs 11/28/2013.  FINDINGS: Moderate to large bilateral layering pleural effusions, the right is larger. Simple fluid densitometry suggesting transudate fluid. No pericardial effusion. Cardiomegaly. Widespread atherosclerotic plaque in the aorta. Coronary artery  involvement.  Compressive atelectasis in both lungs. There is also a 7 mm right middle lobe lung nodule on series 3, image 38. Major airways are patent.  No acute or suspicious osseous lesion. Mild chronic left lateral tenth rib fracture. Mild scoliosis.  No axillary or mediastinal lymphadenopathy.  Visualized upper abdomen remarkable for confluent dystrophic calcifications throughout the visible pancreas.  IMPRESSION: 1. Moderate to large right greater than left layering pleural effusions with compressive atelectasis. 2. Seven mm right middle lobe lung nodule (series 3, image 38). If the patient is at high risk for bronchogenic carcinoma, follow-up chest CT at 3-6 months is recommended. If the patient is at low risk for bronchogenic carcinoma, follow-up chest CT at 6-12 months is recommended. This recommendation follows the consensus statement: Guidelines for Management of Small Pulmonary Nodules Detected on CT Scans: A Statement from the Fleischner Society as published in Radiology 2005; 237:395-400. 3. Chronic calcific pancreatitis.   Electronically Signed   By: Augusto Gamble M.D.   On: 11/29/2013 13:54   Dg Pelvis Portable  12/05/2013   CLINICAL DATA:  Postoperative radiographs for left-sided hemiarthroplasty.  EXAM: PORTABLE PELVIS 1-2 VIEWS  COMPARISON:  Left hip radiographs performed earlier today at 11:56 a.m.  FINDINGS: The patient is status post left hip hemiarthroplasty. The prosthesis demonstrates normal alignment, without evidence of loosening. No new fractures are seen. Overlying soft tissue air is noted. The right hip is grossly unremarkable in appearance. The visualized portions of the sacroiliac joints are grossly unremarkable. The visualized bowel gas pattern is within normal limits.  IMPRESSION: Status post left hip hemiarthroplasty; the prosthesis demonstrates normal alignment, without evidence of loosening. No new fracture seen.   Electronically Signed   By: Roanna Raider M.D.   On: 12/05/2013  00:53   Dg Chest Port 1 View  12/05/2013   CLINICAL DATA:  Check endotracheal tube position.  EXAM: PORTABLE CHEST - 1 VIEW  COMPARISON:  DG CHEST 1V PORT dated 12/04/2013; CT CHEST W/CM dated 11/29/2013; DG CHEST 2 VIEW dated 11/28/2013  FINDINGS: Endotracheal tube terminates approximately 6.2 cm above the  carina. Nasogastric tube is followed into the stomach with the tip projecting beyond the inferior margin of the image. Heart size stable. Thoracic aorta is calcified. Bibasilar airspace consolidation with bilateral pleural effusions, stable. There may be mild interstitial prominence as well.  IMPRESSION: 1. Bilateral effusions and bibasilar airspace consolidation. Pneumonia not excluded. 2. Possible mild interstitial prominence suggesting edema.   Electronically Signed   By: Leanna Battles M.D.   On: 12/05/2013 07:20   Dg Chest Portable 1 View  12/04/2013   CLINICAL DATA:  FALL HIP INJURY  EXAM: PORTABLE CHEST - 1 VIEW  COMPARISON:  CT CHEST W/CM dated 11/29/2013  FINDINGS: Cardiac silhouette is enlarged. A right pleural effusion is appreciated tracking along the lateral chest wall. A smaller left effusion is identified. Areas of increased density projects within the lung bases. There is prominence of the interstitial markings. A wedge-shaped area of increased density extends from the right hilar region and appears to track along the minor minor fissure likely representing fluid. Degenerative changes are appreciated within the shoulders.  IMPRESSION: Interstitial findings likely reflecting pulmonary edema.  Bilateral pleural effusions right greater than left  Likely fluid along the minor fissure  Likely bibasilar atelectasis.   Electronically Signed   By: Salome Holmes M.D.   On: 12/04/2013 13:53    Microbiology: Recent Results (from the past 240 hour(s))  MRSA PCR SCREENING     Status: None   Collection Time    12/04/13  5:59 PM      Result Value Ref Range Status   MRSA by PCR NEGATIVE  NEGATIVE Final    Comment:            The GeneXpert MRSA Assay (FDA     approved for NASAL specimens     only), is one component of a     comprehensive MRSA colonization     surveillance program. It is not     intended to diagnose MRSA     infection nor to guide or     monitor treatment for     MRSA infections.     Labs: Basic Metabolic Panel:  Recent Labs Lab 12/05/13 0410 12/06/13 0515 12/07/13 0500 12/08/13 2107 12/09/13 0640  NA 127* 126* 125* 131* 130*  K 4.7 4.4 4.1 4.7 4.7  CL 90* 90* 88* 94* 93*  CO2 24 23 24 26 25   GLUCOSE 150* 88 140* 130* 189*  BUN 26* 20 17 24* 23  CREATININE 0.77 0.68 0.62 0.79 0.67  CALCIUM 8.0* 7.9* 8.0* 8.3* 8.5  MG  --  1.8 2.2 2.1  --   PHOS  --  3.5 2.9 2.6  --    Liver Function Tests: No results found for this basename: AST, ALT, ALKPHOS, BILITOT, PROT, ALBUMIN,  in the last 168 hours No results found for this basename: LIPASE, AMYLASE,  in the last 168 hours No results found for this basename: AMMONIA,  in the last 168 hours CBC:  Recent Labs Lab 12/04/13 1217 12/04/13 2246 12/05/13 0410 12/06/13 0515 12/07/13 0500 12/08/13 2107  WBC 5.3  --  6.4 6.3 7.4 9.2  NEUTROABS 3.8  --   --   --   --   --   HGB 10.2* 9.2* 8.9* 8.7* 7.6* 8.2*  HCT 28.6* 27.0* 24.5* 23.6* 20.6* 22.8*  MCV 87.2  --  85.4 85.2 84.4 85.7  PLT 200  --  172 200 204 227   Cardiac Enzymes:  Recent Labs Lab 12/04/13 1449  TROPONINI <0.30   BNP: BNP (last 3 results)  Recent Labs  12/01/13 0328 12/04/13 1449 12/08/13 1310  PROBNP 9675.0* 5148.0* 16727.0*   CBG:  Recent Labs Lab 12/09/13 1715 12/09/13 2113 12/10/13 0635 12/10/13 0707 12/10/13 0744  GLUCAP 226* 209* 59* 64* 77       Signed:  Alixandria Friedt K Maizey Menendez  [facility] 12/10/2013, 8:53 AM

## 2013-12-10 NOTE — Progress Notes (Signed)
Patient d/c to SNF, report called to SNF. 

## 2013-12-11 ENCOUNTER — Non-Acute Institutional Stay (SKILLED_NURSING_FACILITY): Payer: Medicare Other | Admitting: Internal Medicine

## 2013-12-11 ENCOUNTER — Other Ambulatory Visit: Payer: Self-pay | Admitting: *Deleted

## 2013-12-11 DIAGNOSIS — S72142D Displaced intertrochanteric fracture of left femur, subsequent encounter for closed fracture with routine healing: Secondary | ICD-10-CM

## 2013-12-11 DIAGNOSIS — I5042 Chronic combined systolic (congestive) and diastolic (congestive) heart failure: Secondary | ICD-10-CM

## 2013-12-11 DIAGNOSIS — I509 Heart failure, unspecified: Secondary | ICD-10-CM

## 2013-12-11 DIAGNOSIS — E1165 Type 2 diabetes mellitus with hyperglycemia: Secondary | ICD-10-CM

## 2013-12-11 DIAGNOSIS — E1142 Type 2 diabetes mellitus with diabetic polyneuropathy: Secondary | ICD-10-CM

## 2013-12-11 DIAGNOSIS — F0390 Unspecified dementia without behavioral disturbance: Secondary | ICD-10-CM

## 2013-12-11 DIAGNOSIS — E43 Unspecified severe protein-calorie malnutrition: Secondary | ICD-10-CM

## 2013-12-11 DIAGNOSIS — IMO0002 Reserved for concepts with insufficient information to code with codable children: Secondary | ICD-10-CM

## 2013-12-11 DIAGNOSIS — I5021 Acute systolic (congestive) heart failure: Secondary | ICD-10-CM

## 2013-12-11 DIAGNOSIS — S72009D Fracture of unspecified part of neck of unspecified femur, subsequent encounter for closed fracture with routine healing: Secondary | ICD-10-CM

## 2013-12-11 DIAGNOSIS — Z96642 Presence of left artificial hip joint: Secondary | ICD-10-CM

## 2013-12-11 DIAGNOSIS — R911 Solitary pulmonary nodule: Secondary | ICD-10-CM

## 2013-12-11 DIAGNOSIS — E1140 Type 2 diabetes mellitus with diabetic neuropathy, unspecified: Secondary | ICD-10-CM

## 2013-12-11 DIAGNOSIS — E1149 Type 2 diabetes mellitus with other diabetic neurological complication: Secondary | ICD-10-CM

## 2013-12-11 DIAGNOSIS — Z96649 Presence of unspecified artificial hip joint: Secondary | ICD-10-CM

## 2013-12-11 DIAGNOSIS — G2401 Drug induced subacute dyskinesia: Secondary | ICD-10-CM

## 2013-12-11 NOTE — Progress Notes (Signed)
Patient ID: [name] [name], male   DOB: [birth year], 73 y.o.   MRN: [id number]  Provider:  Gwenith Spitz. Renato Gails, D.O., C.M.D.  Location:  [facility]  PCP: Verlon Au, MD  Code Status: DNR   No Known Allergies  Chief Complaint  Patient presents with  . Hospitalization Follow-up    repeat admission s/p hospitalization this time with hip fracture    HPI: 73 y.o. male with h/o chronic combined systolic and diastolic chf, malnutrition, DMII, dementia, neuropathy, tardive dyskinesia was readmitted here after his latest hospitalization with left hip fx s/p mechanical fall.  His stay was complicated by acute on chronic chf, hyponatremia, and uncontrolled glucose.  When seen, he c/o his pants being too tight and he appeared edematous.  ROS: Review of Systems  Constitutional: Positive for malaise/fatigue. Negative for fever.  Respiratory: Negative for shortness of breath.   Cardiovascular: Positive for leg swelling. Negative for chest pain.  Gastrointestinal: Negative for abdominal pain.  Genitourinary: Negative for dysuria.  Musculoskeletal: Positive for falls.  Neurological: Positive for dizziness and weakness. Negative for loss of consciousness and headaches.  Endo/Heme/Allergies:       Diabetes  Psychiatric/Behavioral: Positive for memory loss.     Past Medical History  Diagnosis Date  . Pancreatic disorder   . Cardiomyopathy, dilated   . Hypertension 11/29/2013  . Protein-calorie malnutrition, severe 12/01/2013  . High cholesterol 11/2013  . Chronic combined systolic and diastolic CHF (congestive heart failure):  Echo 11/29/13:  EF 15%, Grade two diastolic disfunction. 11/28/2013  . Type II diabetes mellitus   . Lung nodule     "incidental, >3 mm and < 67mm" (12/04/2013)  . Pleural effusion   . Femoral neck fracture 12/04/2013    "left; fell at nursing home today"    Past Surgical History  Procedure Laterality Date  . Tonsillectomy    . Hip arthroplasty Left  12/04/2013    Procedure: ARTHROPLASTY BIPOLAR HIP;  Surgeon: Shelda Pal, MD;  Location: [facility] OR;  Service: Orthopedics;  Laterality: Left;   Social History:   reports that he has been smoking Cigarettes.  He has a 57 pack-year smoking history. He has never used smokeless tobacco. He reports that he drinks alcohol. He reports that he does not use illicit drugs.  Family History  Problem Relation Age of Onset  . Breast cancer Mother   . CAD Father   . Cancer - Other Sister     Laryngeal    Medications: Patient's Medications  New Prescriptions   No medications on file  Previous Medications   BISACODYL (DULCOLAX) 10 MG SUPPOSITORY    Place 10 mg rectally daily as needed for moderate constipation.   CARVEDILOL (COREG) 3.125 MG TABLET    Take 1 tablet (3.125 mg total) by mouth 2 (two) times daily with a meal.   CHOLECALCIFEROL (VITAMIN D-3) 1000 UNITS CAPS    Take 1,000 Units by mouth daily.   ENOXAPARIN (LOVENOX) 30 MG/0.3ML INJECTION    Inject 0.3 mLs (30 mg total) into the skin daily.   FEEDING SUPPLEMENT, ENSURE COMPLETE, (ENSURE COMPLETE) LIQD    Take 237 mLs by mouth 2 (two) times daily between meals.   FERROUS SULFATE 325 (65 FE) MG TABLET    Take 1 tablet (325 mg total) by mouth daily with breakfast.   FLUOXETINE (PROZAC) 20 MG CAPSULE    Take 20 mg by mouth daily.   FUROSEMIDE (LASIX) 20 MG TABLET    Take 1 tablet (  20 mg total) by mouth daily.   GLIPIZIDE (GLUCOTROL) 10 MG TABLET    Take 0.5 tablets (5 mg total) by mouth 2 (two) times daily before a meal.   HYDROCODONE-ACETAMINOPHEN (NORCO/VICODIN) 5-325 MG PER TABLET    Take 1-2 tablets by mouth every 6 (six) hours as needed for moderate pain.   LIPASE/PROTEASE/AMYLASE (CREON-12/PANCREASE) 12000 UNITS CPEP    Take 1 capsule by mouth 3 (three) times daily with meals.    LISINOPRIL (PRINIVIL,ZESTRIL) 5 MG TABLET    Take 0.5 tablets (2.5 mg total) by mouth daily.   MULTIPLE VITAMINS-MINERALS (MULTIVITAMIN WITH MINERALS) TABLET    Take  1 tablet by mouth daily.   NICOTINE (NICODERM CQ - DOSED IN MG/24 HOURS) 21 MG/24HR PATCH    Place 21 mg onto the skin daily.   POLYETHYLENE GLYCOL (MIRALAX / GLYCOLAX) PACKET    Take 17 g by mouth daily as needed for mild constipation.   PREGABALIN (LYRICA) 50 MG CAPSULE    Take 1 capsule (50 mg total) by mouth 3 (three) times daily.   SIMVASTATIN (ZOCOR) 20 MG TABLET    Take 1 tablet (20 mg total) by mouth daily at 6 PM.  Modified Medications   No medications on file  Discontinued Medications   No medications on file     Physical Exam:  Physical Exam  Constitutional: No distress.  HENT:  Head: Normocephalic and atraumatic.  Right Ear: External ear normal.  Left Ear: External ear normal.  Nose: Nose normal.  Mouth/Throat: Oropharynx is clear and moist.  Eyes: Conjunctivae and EOM are normal. Pupils are equal, round, and reactive to light.  Neck: Normal range of motion. Neck supple. No JVD present.  Cardiovascular: Normal rate, regular rhythm, normal heart sounds and intact distal pulses.   Pulmonary/Chest: Effort normal and breath sounds normal. He has no rales.  Abdominal: Soft. Bowel sounds are normal. He exhibits distension. He exhibits no mass. There is no tenderness.  Sacral and abdominal fluid   Musculoskeletal: Normal range of motion.  Neurological: He is alert.  Skin: Skin is warm and dry.     Labs reviewed: Basic Metabolic Panel:  Recent Labs  40/98/11 0515 12/07/13 0500 12/08/13 2107 12/09/13 0640  NA 126* 125* 131* 130*  K 4.4 4.1 4.7 4.7  CL 90* 88* 94* 93*  CO2 23 24 26 25   GLUCOSE 88 140* 130* 189*  BUN 20 17 24* 23  CREATININE 0.68 0.62 0.79 0.67  CALCIUM 7.9* 8.0* 8.3* 8.5  MG 1.8 2.2 2.1  --   PHOS 3.5 2.9 2.6  --    Liver Function Tests:  Recent Labs  03/31/13 1630  AST 40*  ALT 34  ALKPHOS 142*  BILITOT 0.4  PROT 6.4  ALBUMIN 3.1*   No results found for this basename: LIPASE, AMYLASE,  in the last 8760 hours No results found for  this basename: AMMONIA,  in the last 8760 hours CBC:  Recent Labs  03/31/13 1630  12/04/13 1217  12/06/13 0515 12/07/13 0500 12/08/13 2107  WBC 5.0  < > 5.3  < > 6.3 7.4 9.2  NEUTROABS 2.8  --  3.8  --   --   --   --   HGB 11.5*  < > 10.2*  < > 8.7* 7.6* 8.2*  HCT 32.8*  < > 28.6*  < > 23.6* 20.6* 22.8*  MCV 99.7  < > 87.2  < > 85.2 84.4 85.7  PLT 329  < > 200  < >  200 204 227  < > = values in this interval not displayed. Cardiac Enzymes:  Recent Labs  11/28/13 1920 12/04/13 1449  TROPONINI <0.30 <0.30   BNP: No components found with this basename: POCBNP,  CBG:  Recent Labs  12/10/13 0744 12/10/13 1133 12/10/13 1643  GLUCAP 77 226* 294*   Lab Results  Component Value Date   HGBA1C 7.9* 11/29/2013   Imaging and Procedures: Reviewed  Assessment/Plan 1. Intertrochanteric fracture of left hip, closed, with routine healing, subsequent encounter -s/p surgery -here for rehab and possibly long term care  2. H/O total hip arthroplasty, left -cont pt, ot  3. Chronic combined systolic and diastolic CHF (congestive heart failure):  Echo 11/29/13:  EF 15%, Grade two diastolic disfunction. -daily weights--suspect needs increased diuretic at present, but weights not yet available--abdomen is swollen  4. Acute systolic CHF (congestive heart failure), NYHA class 2 -was said to resolve at hospital with diuresis  5. Type 2 diabetes, uncontrolled, with neuropathy -has been poorly controlled, will need frequent reeval of cbgs  -monitor carefully with glipizide to avoid hypoglycemia (would favor d/c) -cont ace, not on asa  6. Tardive dyskinesia -noted during hospitalization,monitor and avoid precipitating antipsychotic agents  7. Dementia without behavioral disturbance -not on medications for his dementia at this time  8. Protein-calorie malnutrition, severe -will provide supplements and monitor weights  9. Incidental lung nodule, > 3mm and < 8mm -would check f/u CT in  3-6 mos if family desires to monitor this

## 2013-12-17 ENCOUNTER — Other Ambulatory Visit: Payer: Self-pay | Admitting: Adult Health

## 2013-12-17 ENCOUNTER — Encounter: Payer: Self-pay | Admitting: Internal Medicine

## 2013-12-17 ENCOUNTER — Ambulatory Visit (INDEPENDENT_AMBULATORY_CARE_PROVIDER_SITE_OTHER)
Admission: RE | Admit: 2013-12-17 | Discharge: 2013-12-17 | Disposition: A | Discharge status: Home / Self Care | Payer: Medicare Other | Source: Ambulatory Visit | Attending: Adult Health | Admitting: Adult Health

## 2013-12-17 ENCOUNTER — Encounter: Payer: Self-pay | Admitting: Adult Health

## 2013-12-17 ENCOUNTER — Non-Acute Institutional Stay (SKILLED_NURSING_FACILITY): Payer: Medicare Other | Admitting: Internal Medicine

## 2013-12-17 ENCOUNTER — Ambulatory Visit (INDEPENDENT_AMBULATORY_CARE_PROVIDER_SITE_OTHER): Payer: Medicare Other | Admitting: Adult Health

## 2013-12-17 VITALS — BP 120/68 | HR 83 | Temp 97.4°F

## 2013-12-17 DIAGNOSIS — R0602 Shortness of breath: Secondary | ICD-10-CM

## 2013-12-17 DIAGNOSIS — J9600 Acute respiratory failure, unspecified whether with hypoxia or hypercapnia: Secondary | ICD-10-CM

## 2013-12-17 DIAGNOSIS — S72009A Fracture of unspecified part of neck of unspecified femur, initial encounter for closed fracture: Secondary | ICD-10-CM

## 2013-12-17 DIAGNOSIS — I5042 Chronic combined systolic (congestive) and diastolic (congestive) heart failure: Secondary | ICD-10-CM

## 2013-12-17 DIAGNOSIS — R531 Weakness: Secondary | ICD-10-CM

## 2013-12-17 DIAGNOSIS — J90 Pleural effusion, not elsewhere classified: Secondary | ICD-10-CM

## 2013-12-17 DIAGNOSIS — S72002A Fracture of unspecified part of neck of left femur, initial encounter for closed fracture: Secondary | ICD-10-CM

## 2013-12-17 DIAGNOSIS — R911 Solitary pulmonary nodule: Secondary | ICD-10-CM

## 2013-12-17 DIAGNOSIS — R5381 Other malaise: Secondary | ICD-10-CM

## 2013-12-17 DIAGNOSIS — I509 Heart failure, unspecified: Secondary | ICD-10-CM

## 2013-12-17 DIAGNOSIS — R5383 Other fatigue: Secondary | ICD-10-CM

## 2013-12-17 NOTE — Assessment & Plan Note (Signed)
Appears euvolemic  Cont on current regimen  follow up with Cardiology as planned next week.

## 2013-12-17 NOTE — Progress Notes (Signed)
Subjective:    Patient ID: [name] [name], male    DOB: August 08, 1941, 73 y.o.   MRN: [id number]  HPI 73 year old male seen for initial pulmonary critical care consult during hospitalization. December 04 2013 for acute respiratory. Failure, secondary to acute on chronic congestive heart failure  12/17/2013 Post Hospital follow up  Patient returns for a post hospital followup He was admitted April 7 through April 13 for acute on chronic congestive heart failure complicated by acute respiratory failure requiring ventilator support. Patient has a history of chronic alcohol abuse combined, severe systolic and diastolic heart failure with ejection fracture 50%, diabetes, dementia. He was admitted to the hospital after a fall resulting in a left hip fracture. Patient underwent hip surgery and postop had some difficulty with extubation. Pulmonary critical care was consult for vent management. He did require some diuresis, secondary to acute decompensated heart failure. CT chest did show an incidental finding of a 7 mm right middle lobe lung nodule. Along with moderate to large pleural effusions. Patient is a former smoker and has multiple medical problems , along with his daughter and ex-wife, who are his power of attorney. He was seen by the palliative care team.  He has been made a full no CODE BLUE. With no plans to investigate the incidental lung nodule. I verified this today with his ex-wife [name] [name] .  Since discharge. His breathing has improved. He has decreased shortness, of breath, leg swelling. He denies any cough, hemoptysis, orthopnea. Patient is currently at a rehabilitation center  With plans to have permanent skilled nursing care. Due to his inability to care for himself, malnutrition and dementia .         Review of Systems Constitutional:   No  weight loss, night sweats,  Fevers, chills, +fatigue, or  Lassitude., weakness   HEENT:   No headaches,  Difficulty swallowing,   Tooth/dental problems, or  Sore throat,                No sneezing, itching, ear ache, nasal congestion, post nasal drip,   CV:  No chest pain,  Orthopnea, PND, swelling in lower extremities, anasarca, dizziness, palpitations, syncope.   GI  No heartburn, indigestion, abdominal pain, nausea, vomiting, diarrhea, change in bowel habits, loss of appetite, bloody stools.   Resp: No shortness of breath with exertion or at rest.  No excess mucus, no productive cough,  No non-productive cough,  No coughing up of blood.  No change in color of mucus.  No wheezing.  No chest wall deformity  Skin: no rash or lesions.  GU: no dysuria, change in color of urine, no urgency or frequency.  No flank pain, no hematuria   MS:  No joint pain or swelling.  No decreased range of motion.  +back pain.+hip pain  Psych:  No change in mood or affect. No depression or anxiety.  + memory loss.         Objective:   Physical Exam GEN: A/Ox3; pleasant , NAD, elderly , thin in wheelchair   HEENT:  [place]/AT,  EACs-clear, TMs-wnl, NOSE-clear, THROAT-clear, no lesions, no postnasal drip or exudate noted.   NECK:  Supple w/ fair ROM; no JVD; normal carotid impulses w/o bruits; no thyromegaly or nodules palpated; no lymphadenopathy.  RESP  Decreased BS  w/o, wheezes/ rales/ or rhonchi.no accessory muscle use, no dullness to percussion  CARD:  RRR, no m/r/g  , no peripheral edema, pulses intact, no cyanosis or clubbing.  GI:  Soft & nt; nml bowel sounds; no organomegaly or masses detected.  Musco: Warm bil, no deformities or joint swelling noted.   Neuro: alert, no focal deficits noted.    Skin: Warm, no lesions or rashes         Assessment & Plan:

## 2013-12-17 NOTE — Patient Instructions (Signed)
Chest xray today  Follow up with our office , pulmonary as needed

## 2013-12-17 NOTE — Consult Note (Signed)
I have reviewed and discussed the care of this patient in detail with the nurse practitioner including pertinent patient records, physical exam findings and data. I agree with details of this encounter.  

## 2013-12-17 NOTE — Assessment & Plan Note (Signed)
Incidental lung nodule finding on CT in setting of smoker  Case discussed with pt POA-they do not wish to investigate this any further as pt has severe chronic medical problems that would be difficult to undergo further testing and possible treatment options.  Previous palliative care consult  Noted , pt is DNR .  Advised to follow up with our office As needed

## 2013-12-17 NOTE — Assessment & Plan Note (Addendum)
Resolved  Will repeat to make pleural effusion are improved   Plan  Check cxr today  follow up with our office As needed

## 2013-12-17 NOTE — Progress Notes (Signed)
Patient ID: [name] [name], male   DOB: 10-Dec-1940, 73 y.o.   MRN: [id number]   This is an acute visit.  Level of care skilled.  Facility AF.  Chief complaint-acute visit secondary to left hip discomfort.  History of present illness.  Patient is a 73 year old male who recently sustained a left hip fracture that was subsequently repaired.  .  This is complicated with a history of dementia as well as significant combined congestive heart failure with ejection fraction of 15%.  He is currently seen by palliative care services.  Recommendation is for mainly comfort measures.-   Apparently patient has complained of some increased left hip discomfort the last day or 2 especially with movement--- patient apparently has received physical therapy.  Patient is a poor historian and cannot really give much of her review of systems other apparently when his left hip areas palpated significantly he does complain of pain.  The pain does at this point appears localized apparently yesterday was complaining about a more generalized pain but does not appear to be the case today he is receiving hydrocodone every 6 hours as needed for pain---    Family medical social history as been reviewed according to discharge summary 13th 2015.  Medications have been reviewed per MAR.  Review of systems-as stated in history of present illness again somewhat limited he is not complaining specifically of any chest pain or shortness of breath apparently does not have much pain when he is not moving but with palpation or movement of the left hip does complain of discomfort  Physical exam.  Temperature is 96.7 pulse 97 respirations 20 blood pressure 152/87.  In general this is a frail elderly male in no distress lying in bed.  His skin is warm and dry left hip surgical site I do not see any drainage bleeding or erythema there is some crusting healing of the surgical site I do not see any sign of infection or  acute tenderness edema around the site.  Chest clear to auscultation with poor respiratory effort shallow air entry no labored breathing.  Heart is regular rate and rhythm without murmur gallop or rub he does not have significant lower extremity edema.  Abdomen is mildly protuberant soft does not appear to be tender active bowel sounds.  Muscle skeletal surgical site left hip as noted above that otherwise there's able to move his other extremities without discomfort although very frail here.  Appears to have some discomfort when the left hip is palpated or anything involving movement I do not see any acute deformity  Neurologic appears to be grossly intact I do not see any lateralizing findings.  Psych he appears to be alert and oriented to self only was cooperative with exam.  Labs.  12/09/2013.  Sodium 1:30 potassium 4.7 BUN 23 creatinine 0.67.  12/08/2013    WBC 9.2 hemoglobin 8.2 platelets 200.   Assessment and plan.  #1-left hip pain-I discussed this with his wife extensively at bedside-will order an x-ray of the area left hip pelvis and leg-his wife  was concerned he may be developing an infection we'll order a UA C andS--patient saw pulmonology earlier today and chest x-ray already has been obtained as well.   I did discuss obtaining lab work with his wife-again the emphasis is on comfort care and I told his wife we would check with the palliative care nurse practitioner-I have spoken with her and we will not order labs since the emphasis is on comfort care and  the nurse practitioner from palliative care will meet with the family later this week  [id number]-of note greater than 25 minutes spent assessing patient-and coordinating formulating a plan of care-of note greater than 50% of time spent coordinating plan of care

## 2013-12-19 ENCOUNTER — Encounter: Payer: Self-pay | Admitting: Internal Medicine

## 2013-12-19 ENCOUNTER — Non-Acute Institutional Stay (SKILLED_NURSING_FACILITY): Payer: Medicare Other | Admitting: Internal Medicine

## 2013-12-19 DIAGNOSIS — I5042 Chronic combined systolic (congestive) and diastolic (congestive) heart failure: Secondary | ICD-10-CM

## 2013-12-19 DIAGNOSIS — R404 Transient alteration of awareness: Secondary | ICD-10-CM

## 2013-12-19 DIAGNOSIS — F0390 Unspecified dementia without behavioral disturbance: Secondary | ICD-10-CM

## 2013-12-19 DIAGNOSIS — R410 Disorientation, unspecified: Secondary | ICD-10-CM

## 2013-12-19 DIAGNOSIS — I509 Heart failure, unspecified: Secondary | ICD-10-CM

## 2013-12-19 NOTE — Progress Notes (Signed)
Patient ID: [name] [name], male   DOB: 1940/10/21, 73 y.o.   MRN: [id number]  Location:  [facility] Provider:  Gwenith Spitz. Renato Gails, D.O., C.M.D.  Code Status:  DNR  Chief Complaint  Patient presents with  . Acute Visit  . Altered Mental Status    after receiving hydrocodone/apap, then phenergan for nausea    HPI:  73 yo male here for rehab s/p hospitalization with CHF and recent left hip fx s/p repair.    Review of Systems:  Review of Systems  Constitutional: Negative for fever.  Respiratory: Negative for shortness of breath.   Cardiovascular: Negative for chest pain.  Gastrointestinal: Positive for nausea and constipation.  Musculoskeletal: Positive for joint pain.  Psychiatric/Behavioral: Positive for memory loss.    Medications: Patient's Medications  New Prescriptions   No medications on file  Previous Medications   BISACODYL (DULCOLAX) 10 MG SUPPOSITORY    Place 10 mg rectally daily as needed for moderate constipation.   CARVEDILOL (COREG) 3.125 MG TABLET    Take 1 tablet (3.125 mg total) by mouth 2 (two) times daily with a meal.   CHOLECALCIFEROL (VITAMIN D-3) 1000 UNITS CAPS    Take 1,000 Units by mouth daily.   FEEDING SUPPLEMENT, ENSURE COMPLETE, (ENSURE COMPLETE) LIQD    Take 237 mLs by mouth 2 (two) times daily between meals.   FERROUS SULFATE 325 (65 FE) MG TABLET    Take 1 tablet (325 mg total) by mouth daily with breakfast.   FLUOXETINE (PROZAC) 20 MG CAPSULE    Take 20 mg by mouth daily.   FUROSEMIDE (LASIX) 20 MG TABLET    Take 1 tablet (20 mg total) by mouth daily.   GABAPENTIN (NEURONTIN) 100 MG CAPSULE    Take 100 mg by mouth 3 (three) times daily.   GLIPIZIDE (GLUCOTROL) 10 MG TABLET    Take 0.5 tablets (5 mg total) by mouth 2 (two) times daily before a meal.   LIPASE/PROTEASE/AMYLASE (CREON-12/PANCREASE) 12000 UNITS CPEP    Take 1 capsule by mouth 3 (three) times daily with meals.    LISINOPRIL (PRINIVIL,ZESTRIL) 5 MG TABLET    Take 0.5  tablets (2.5 mg total) by mouth daily.   MULTIPLE VITAMINS-MINERALS (MULTIVITAMIN WITH MINERALS) TABLET    Take 1 tablet by mouth daily.   NICOTINE (NICODERM CQ - DOSED IN MG/24 HOURS) 21 MG/24HR PATCH    Place 21 mg onto the skin daily.   ONDANSETRON (ZOFRAN) 4 MG TABLET    Take 4 mg by mouth every 8 (eight) hours as needed for nausea or vomiting.   POLYETHYLENE GLYCOL (MIRALAX / GLYCOLAX) PACKET    Take 17 g by mouth daily as needed for mild constipation.   SIMVASTATIN (ZOCOR) 20 MG TABLET    Take 1 tablet (20 mg total) by mouth daily at 6 PM.   TRAMADOL (ULTRAM) 50 MG TABLET    Take 50 mg by mouth every 6 (six) hours as needed for severe pain.  Modified Medications   No medications on file  Discontinued Medications   ENOXAPARIN (LOVENOX) 30 MG/0.3ML INJECTION    Inject 0.3 mLs (30 mg total) into the skin daily.   HYDROCODONE-ACETAMINOPHEN (NORCO/VICODIN) 5-325 MG PER TABLET    Take 1-2 tablets by mouth every 6 (six) hours as needed for moderate pain.    Physical Exam: Filed Vitals:   12/19/13 1627  BP: 132/84  Pulse: 86  Temp: 97 F (36.1 C)  Resp: 18  Weight: 137 lb 3.2 oz (  62.234 kg)  Physical Exam  Cardiovascular: Normal rate, regular rhythm, normal heart sounds and intact distal pulses.   Pulmonary/Chest: Effort normal and breath sounds normal.  Abdominal: Soft. Bowel sounds are normal. He exhibits no distension.  Neurological: He is alert.  confused     Labs reviewed: Basic Metabolic Panel:  Recent Labs  13/03/6503/09/15 0515 12/07/13 0500 12/08/13 2107 12/09/13 0640  NA 126* 125* 131* 130*  K 4.4 4.1 4.7 4.7  CL 90* 88* 94* 93*  CO2 23 24 26 25   GLUCOSE 88 140* 130* 189*  BUN 20 17 24* 23  CREATININE 0.68 0.62 0.79 0.67  CALCIUM 7.9* 8.0* 8.3* 8.5  MG 1.8 2.2 2.1  --   PHOS 3.5 2.9 2.6  --     Liver Function Tests:  Recent Labs  03/31/13 1630  AST 40*  ALT 34  ALKPHOS 142*  BILITOT 0.4  PROT 6.4  ALBUMIN 3.1*    CBC:  Recent Labs  03/31/13 1630   12/04/13 1217  12/06/13 0515 12/07/13 0500 12/08/13 2107  WBC 5.0  < > 5.3  < > 6.3 7.4 9.2  NEUTROABS 2.8  --  3.8  --   --   --   --   HGB 11.5*  < > 10.2*  < > 8.7* 7.6* 8.2*  HCT 32.8*  < > 28.6*  < > 23.6* 20.6* 22.8*  MCV 99.7  < > 87.2  < > 85.2 84.4 85.7  PLT 329  < > 200  < > 200 204 227  < > = values in this interval not displayed.  Significant Diagnostic Results:  12/17/13:  Left hip xrays:  No acute abnormality  Assessment/Plan 1. Acute delirium -due to meds as above, try to avoid those that can worsen his cognition like sedatives/hypnotics/narcotics as much as possible, but maintain pain control  2. Dementia without behavioral disturbance -underlying, but has declined significnatlly since hip surgery  3. Chronic combined systolic and diastolic CHF (congestive heart failure) -cont current regimen, has stabilized  Family/ staff Communication: 25 minutes spent discussing his condition with his daughter  Goals of care: DNR, do not hospitalize, no feeding tubes, palliative care  Labs/tests ordered:  Cbc, bmp next draw

## 2013-12-20 ENCOUNTER — Other Ambulatory Visit: Payer: Self-pay | Admitting: *Deleted

## 2013-12-20 MED ORDER — TRAMADOL HCL 50 MG PO TABS: 50.0000 mg | ORAL_TABLET | Freq: Four times a day (QID) | ORAL | Status: AC | PRN

## 2013-12-20 NOTE — Progress Notes (Signed)
Quick Note:  Called spoke with patient's daughter Amil Amen and advised of cxr results / recs as stated by TP. Raynelle Fanning verbalized her understanding and denied any questions / concerns. ______

## 2013-12-20 NOTE — Telephone Encounter (Signed)
[facility] 

## 2013-12-27 ENCOUNTER — Encounter: Payer: Medicare Other | Admitting: Physician Assistant

## 2013-12-31 ENCOUNTER — Non-Acute Institutional Stay (SKILLED_NURSING_FACILITY): Payer: Medicare Other | Admitting: Internal Medicine

## 2013-12-31 DIAGNOSIS — I420 Dilated cardiomyopathy: Secondary | ICD-10-CM

## 2013-12-31 DIAGNOSIS — S72002A Fracture of unspecified part of neck of left femur, initial encounter for closed fracture: Secondary | ICD-10-CM

## 2013-12-31 DIAGNOSIS — S72009A Fracture of unspecified part of neck of unspecified femur, initial encounter for closed fracture: Secondary | ICD-10-CM

## 2013-12-31 DIAGNOSIS — D649 Anemia, unspecified: Secondary | ICD-10-CM

## 2013-12-31 DIAGNOSIS — I428 Other cardiomyopathies: Secondary | ICD-10-CM

## 2013-12-31 NOTE — Progress Notes (Signed)
Patient ID: Felipa EmoryClyde Brunker, male   DOB: 24-Jan-1941, 73 y.o.   MRN: [id number] Facility; [facility] Chief complaint; review of CHF, medications History; this is a patient to be received after staying at [facility] from 4/7 through 4/13. He suffered a left hip fracture. Even though he has an ejection fraction of 15% he apparently got through a right hip bipolar arthroplasty on 4/7. He required prolonged intubation and pressors however he seemed to do quite well. He had an echocardiogram on 11/29/13 showing ejection fraction of 15% and grade 2 diastolic dysfunction. He had apparently been in the hospital up until the day before  presenting with his hip fracture for congestive heart failure and actually fell in another nursing home.   He remains on Lovenox which I think was being used for DVT prophylaxis. He has significant dementia and apparently has not done well with physical therapy in fact they're getting ready to discharge him tomorrow and my understanding is he is going to be a long-term resident.  Past Medical History  Diagnosis Date  . Pancreatic disorder   . Cardiomyopathy, dilated   . Hypertension 11/29/2013  . Protein-calorie malnutrition, severe 12/01/2013  . High cholesterol 11/2013  . Chronic combined systolic and diastolic CHF (congestive heart failure):  Echo 11/29/13:  EF 15%, Grade two diastolic disfunction. 11/28/2013  . Type II diabetes mellitus   . Lung nodule     "incidental, >3 mm and < 8mm" (12/04/2013)  . Pleural effusion   . Femoral neck fracture 12/04/2013    "left; fell at nursing home today"    Past Surgical History  Procedure Laterality Date  . Tonsillectomy    . Hip arthroplasty Left 12/04/2013    Procedure: ARTHROPLASTY BIPOLAR HIP;  Surgeon: Shelda PalMatthew D Olin, MD;  [facility];  Service: Orthopedics;  Laterality: Left;    Medication;  Coreg, 3.125 twice a day Lovenox 30 daily Ferrous sulfate 325 daily Prozac 20 daily Lasix 20 daily Glucotrol 5 mg twice a day Norco  one tablet every 6 when necessary Creon-12/Pancrease one capsule 3 times daily with meals Prinivil 2.5 daily Nicotine 21 mg per 24-hour patch 21 mg on to the skin daily MiraLax 17 g daily Lyrica 50 mg 3 times a day Zocor 20 daily Vitamin D. 1000 units daily  Review of systems Respiratory no cough no sputum Cardiac no chest pain no edema GI no nausea no vomiting Musculoskeletal per physical therapy he is on maximal assisted transfer and use the walker  Physical examination General patient is not in any distress Respiratory clear entry bilaterally Cardiac heart sounds are distant however there is no evidence of CHF Extremities flexion contractures developing in the hip and left knee. Most remarkably he he has markedly increased on in his bilateral lower extremities. His reflexes are difficult to elicit at the knee jerks are. Both plantar responses are equivocal. Tone in his upper extremities is normal. This is not a obvious extraparametal syndrome  Impression/plan #1 status post left hip fracture. This appears to be remarkably stable. I don't think he needs for Lovenox any longer I can see another reason for this #2 severe cardiomyopathy with combined systolic and diastolic heart failure and an EF of 15%. This appears to be compensated now. #3 listed as having tardive dyskinesia. I don't see this, the bilateral increased tone in his lower extremities is of unclear etiology. He apparently developed hemiballismus-like movements postop and his Reglan was discontinued. Once again I don't think this is an EPS  syndrome. #4 dementia. #5 following with palliative care here.  ReMarkably everything seems to have done well in terms of this man's cardiomyopathy. He is lost the weight that gradually from 138 pounds down to 121 pounds in his stay here. His blood sugars appear to be under marginal control however the tight diabetic control is probably not indicated here. Lab work from 4/28 showed a  hemoglobin of 8.1 Basic metabolic panel showed a sodium of 130 BUN of 11 creatinine of 0.5. I am really unclear about the nature of this man's neurologic problem. He has a very markedly increased tone in his legs which is not present in his arms. I really couldn't determine any other upper motor neuron signs. I would like to review rereview this. He apparently is going to become a long-term patient here

## 2014-01-07 ENCOUNTER — Non-Acute Institutional Stay (SKILLED_NURSING_FACILITY): Payer: Medicare Other | Admitting: Internal Medicine

## 2014-01-07 DIAGNOSIS — R531 Weakness: Secondary | ICD-10-CM

## 2014-01-07 DIAGNOSIS — M62838 Other muscle spasm: Secondary | ICD-10-CM

## 2014-01-07 DIAGNOSIS — F0280 Dementia in other diseases classified elsewhere without behavioral disturbance: Secondary | ICD-10-CM

## 2014-01-07 DIAGNOSIS — G309 Alzheimer's disease, unspecified: Secondary | ICD-10-CM

## 2014-01-07 DIAGNOSIS — M6240 Contracture of muscle, unspecified site: Secondary | ICD-10-CM

## 2014-01-07 DIAGNOSIS — R5381 Other malaise: Secondary | ICD-10-CM

## 2014-01-07 DIAGNOSIS — R5383 Other fatigue: Secondary | ICD-10-CM

## 2014-01-08 NOTE — Progress Notes (Addendum)
Patient ID: [name] [name], male   DOB: 1940-09-10, 73 y.o.   MRN: [id number]                  PROGRESS NOTE  DATE:  01/07/2014    FACILITY: [facility]    LEVEL OF CARE:   SNF   Acute Visit   HISTORY OF PRESENT ILLNESS:   This is a patient whom I saw for the first time last week.  He was admitted to [facility] from 12/04/2013 through 12/12/2013 with a left hip fracture.    He had a severe cardiomyopathy with an ejection fraction of 15%.    It was also noted that he had a right middle lobe lung nodule at about 7 mm and follow-up CT scans were suggested.    Finally, he has dementia with behavioral disturbances, tardive dyskinesia.     When I saw him last week, I noted extreme increased tone in his bilateral lower extremities.  He has not been able to rehabilitate here.  They could not even get him to stand.    He had a CT scan of the head during this hospitalization that did not show an acute process.    His daughter tells me that he was found lost in [place] in July 2014.  He had been living in [place].  He was confused.  It was determined that he could not live on his own.  He lived with her in [place] for a period of time and then through the fall of 2014/early 2015 lived in the independent part of [place], and then finally for March was in the assisted living.  He  apparently could walk at that time, would walk outside to smoke cigarettes.   He was admitted in early April with congestive heart failure, then came to [facility], and then went back with a hip fracture.    He has been constantly losing weight while he was here, from 137.8 on 12/11/2013 down to 111.2 on 01/07/2014, representing a weight loss of over 26 pounds in less than a month.    PHYSICAL EXAMINATION:   GENERAL APPEARANCE:  This is a frail-looking man in no distress.   CHEST/RESPIRATORY:  Shallow air entry bilaterally.   CARDIOVASCULAR:  CARDIAC:  Heart sounds are distant.  There are no signs  of congestive heart failure.   GASTROINTESTINAL:  LIVER/SPLEEN/KIDNEYS:  No liver, no spleen.  No tenderness.   NEUROLOGICAL:    TONE:  There is tremendous increased tone in both his legs, which I had noted last week.  He has flexion contracture of the left hip and left knee.  He can lift his legs to command.  However, the tone is highly suggestive of an upper motor neuron process.   DEEP TENDON REFLEXES:  His reflexes are absent at the knees and ankles.   I cannot be sure of his Babinski response.   SENSATION/STRENGTH:  He appears to have some loss of sensation, especially to noxious stimuli, although he could identify light touch.    ASSESSMENT/PLAN:  Dementing illness, which  apparently has been progressive over the last year.    Markedly increased tone in his legs.  This is not an extrapyramidal syndrome.  His arms are normal.   I would wonder if there is some form of spinal cord compression here.    Dilated cardiomyopathy.  At this point, this is reasonably stable.    Type 2 diabetes.  On oral agents.  Weight loss.  Some of this was clearly heart failure.  However, I am not sure of how much he is actually eating.  I have not repeated his lab work.     The patient had been No Code and comfort care orders in place.  He has a [employer] MOST form on his chart.  I suspect this man probably has some form of spinal cord issue.  By the daughter's description, it has been relatively acute.  However, given everything, I do not think further investigations are indicated.  He certainly would not be a candidate for aggressive treatments.

## 2014-01-11 ENCOUNTER — Non-Acute Institutional Stay (SKILLED_NURSING_FACILITY): Payer: Medicare Other | Admitting: Internal Medicine

## 2014-01-11 ENCOUNTER — Encounter: Payer: Self-pay | Admitting: Internal Medicine

## 2014-01-11 DIAGNOSIS — I509 Heart failure, unspecified: Secondary | ICD-10-CM

## 2014-01-11 DIAGNOSIS — R197 Diarrhea, unspecified: Secondary | ICD-10-CM

## 2014-01-11 DIAGNOSIS — I5042 Chronic combined systolic (congestive) and diastolic (congestive) heart failure: Secondary | ICD-10-CM

## 2014-01-11 DIAGNOSIS — F0390 Unspecified dementia without behavioral disturbance: Secondary | ICD-10-CM

## 2014-01-11 NOTE — Assessment & Plan Note (Signed)
stable °

## 2014-01-11 NOTE — Progress Notes (Signed)
MRN: [id number] Name: [name] [name]  Sex: male Age: 73 y.o. DOB: 1941/01/24  [facility] Facility/Room:102 Level Of Care: SNF Provider: Margit HanksAnne D Glendel Jaggers Emergency Contacts: Extended Emergency Contact Information Primary Emergency Contact: [facility] Address: [address]          [place], [place] [address] [place] Mobile Phone: [phone] Relation: Daughter Secondary Emergency Contact: Salerno,Dottie Address: [address]          [place], [place] [address] [place] Home Phone: [phone] Mobile Phone: [phone] Relation: Friend  Code Status: DNR - hospice  Allergies: Review of patient's allergies indicates no known allergies.  Chief Complaint  Patient presents with  . Acute Visit    HPI: Patient is 73 y.o. male who nursing asked me to see for diarrhea after each meal for past several days.   Past Medical History  Diagnosis Date  . Pancreatic disorder   . Cardiomyopathy, dilated   . Hypertension 11/29/2013  . Protein-calorie malnutrition, severe 12/01/2013  . High cholesterol 11/2013  . Chronic combined systolic and diastolic CHF (congestive heart failure):  Echo 11/29/13:  EF 15%, Grade two diastolic disfunction. 11/28/2013  . Type II diabetes mellitus   . Lung nodule     "incidental, >3 mm and < 8mm" (12/04/2013)  . Pleural effusion   . Femoral neck fracture 12/04/2013    "left; fell at nursing home today"     Past Surgical History  Procedure Laterality Date  . Tonsillectomy    . Hip arthroplasty Left 12/04/2013    Procedure: ARTHROPLASTY BIPOLAR HIP;  Surgeon: Shelda PalMatthew D Olin, MD;  [facility];  Service: Orthopedics;  Laterality: Left;      Medication List       This list is accurate as of: 01/11/14  2:13 PM.  Always use your most recent med list.               bisacodyl 10 MG suppository  Commonly known as:  DULCOLAX  Place 10 mg rectally daily as needed for moderate constipation.     carvedilol 3.125 MG tablet   Commonly known as:  COREG  Take 1 tablet (3.125 mg total) by mouth 2 (two) times daily with a meal.     feeding supplement (ENSURE COMPLETE) Liqd  Take 237 mLs by mouth 2 (two) times daily between meals.     ferrous sulfate 325 (65 FE) MG tablet  Take 1 tablet (325 mg total) by mouth daily with breakfast.     FLUoxetine 20 MG capsule  Commonly known as:  PROZAC  Take 20 mg by mouth daily.     furosemide 20 MG tablet  Commonly known as:  LASIX  Take 1 tablet (20 mg total) by mouth daily.     gabapentin 100 MG capsule  Commonly known as:  NEURONTIN  Take 100 mg by mouth 3 (three) times daily.     glipiZIDE 10 MG tablet  Commonly known as:  GLUCOTROL  Take 0.5 tablets (5 mg total) by mouth 2 (two) times daily before a meal.     lipase/protease/amylase 4403412000 UNITS Cpep capsule  Commonly known as:  CREON-12/PANCREASE  Take 1 capsule by mouth 3 (three) times daily with meals.     lisinopril 5 MG tablet  Commonly known as:  PRINIVIL,ZESTRIL  Take 0.5 tablets (2.5 mg total) by mouth daily.     multivitamin with minerals tablet  Take 1 tablet by mouth daily.     ondansetron 4 MG  tablet  Commonly known as:  ZOFRAN  Take 4 mg by mouth every 8 (eight) hours as needed for nausea or vomiting.     polyethylene glycol packet  Commonly known as:  MIRALAX / GLYCOLAX  Take 17 g by mouth daily as needed for mild constipation.     simvastatin 20 MG tablet  Commonly known as:  ZOCOR  Take 1 tablet (20 mg total) by mouth daily at 6 PM.     traMADol 50 MG tablet  Commonly known as:  ULTRAM  Take 1 tablet (50 mg total) by mouth every 6 (six) hours as needed for severe pain.     Vitamin D-3 1000 UNITS Caps  Take 1,000 Units by mouth daily.        No orders of the defined types were placed in this encounter.     There is no immunization history on file for this patient.  History  Substance Use Topics  . Smoking status: Former Smoker -- 1.00 packs/day for 57 years    Types:  Cigarettes    Quit date: 11/28/2013  . Smokeless tobacco: Never Used  . Alcohol Use: 0.0 oz/week     Comment: 12/04/2013 "quit drinking in 02/2013"    Review of Systems   Pt has no c/o; Nursing says no other sx, no fever, cough cold SOB, urinary sx or other   Filed Vitals:   01/11/14 1133  BP: 104/64  Pulse: 74  Temp: 97 F (36.1 C)  Resp: 18    Physical Exam  GENERAL APPEARANCE: Alert, conversant. Appropriately groomed. No acute distress  SKIN: No diaphoresis rash HEENT: Unremarkable RESPIRATORY: Breathing is even, unlabored. Lung sounds are clear   CARDIOVASCULAR: Heart RRR no murmurs, rubs or gallops. No peripheral edema  GASTROINTESTINAL: Abdomen is soft, non-tender, not distended w/ normal bowel sounds.  GENITOURINARY: Bladder non tender, not distended  MUSCULOSKELETAL: No abnormal joints or musculature NEUROLOGIC: Cranial nerves 2-12 grossly intact. Moves all extremities no tremor. PSYCHIATRIC: no behavioral issues  Patient Active Problem List   Diagnosis Date Noted  . Palliative care encounter 12/10/2013  . DNR (do not resuscitate) 12/10/2013  . Dementia without behavioral disturbance 12/09/2013  . Acute blood loss anemia 12/09/2013  . Tardive dyskinesia 12/08/2013  . Tobacco abuse 12/08/2013  . Acute respiratory failure 12/05/2013  . Hip fracture, left 12/04/2013  . Hypotension 12/04/2013  . Preoperative cardiovascular examination 12/04/2013  . Protein-calorie malnutrition, severe 12/01/2013  . Pleural effusion 11/30/2013  . Incidental lung nodule, > 68mm and < 64mm 11/30/2013  . Acute systolic CHF (congestive heart failure), NYHA class 2 11/29/2013  . Hypertension 11/29/2013  . Type 2 diabetes, uncontrolled, with neuropathy 11/29/2013  . 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 Weakness generalized 11/29/2013  . SOB (shortness of breath) 11/29/2013  . Chronic combined systolic and diastolic CHF (congestive heart failure):  Echo  11/29/13:  EF 15%, Grade two diastolic disfunction. 11/28/2013    CBC    Component Value Date/Time   WBC 9.2 12/08/2013 2107   RBC 2.66* 12/08/2013 2107   HGB 8.2* 12/08/2013 2107   HCT 22.8* 12/08/2013 2107   PLT 227 12/08/2013 2107   MCV 85.7 12/08/2013 2107   LYMPHSABS 1.0 12/04/2013 1217   MONOABS 0.4 12/04/2013 1217   EOSABS 0.1 12/04/2013 1217   BASOSABS 0.0 12/04/2013 1217    CMP     Component Value Date/Time   NA 130* 12/09/2013 0640   K 4.7 12/09/2013 0640   CL 93* 12/09/2013  0640   CO2 25 12/09/2013 0640   GLUCOSE 189* 12/09/2013 0640   BUN 23 12/09/2013 0640   CREATININE 0.67 12/09/2013 0640   CALCIUM 8.5 12/09/2013 0640   PROT 6.4 03/31/2013 1630   ALBUMIN 3.1* 03/31/2013 1630   AST 40* 03/31/2013 1630   ALT 34 03/31/2013 1630   ALKPHOS 142* 03/31/2013 1630   BILITOT 0.4 03/31/2013 1630   GFRNONAA >90 12/09/2013 0640   GFRAA >90 12/09/2013 0640    Assessment and Plan  DIARRHEA - several times a day; pt is on 12000 units creon with each meal which is a lowish dose so don't think that is it and has been on it a long time;pt has not been on recent abx but is frail and debilitated ; would do stool for WBC, C and S and C Diff because tx will resolve the issue and if neg can give immodium without worrying.  Margit Hanks, MD

## 2014-01-11 NOTE — Assessment & Plan Note (Signed)
Pt is now hospice

## 2014-01-16 ENCOUNTER — Encounter: Payer: Self-pay | Admitting: Internal Medicine

## 2014-01-16 ENCOUNTER — Non-Acute Institutional Stay (SKILLED_NURSING_FACILITY): Payer: Medicare Other | Admitting: Internal Medicine

## 2014-01-16 DIAGNOSIS — I5042 Chronic combined systolic (congestive) and diastolic (congestive) heart failure: Secondary | ICD-10-CM

## 2014-01-16 DIAGNOSIS — I509 Heart failure, unspecified: Secondary | ICD-10-CM

## 2014-01-16 DIAGNOSIS — R197 Diarrhea, unspecified: Secondary | ICD-10-CM

## 2014-01-16 NOTE — Progress Notes (Signed)
MRN: [id number] Name: [name] [name]  Sex: male Age: 73 y.o. DOB: [birth year]  [facility] Facility/Room: 1 D Level Of Care: SNF Provider: Margit Hanks Emergency Contacts: Extended Emergency Contact Information Primary Emergency Contact: [facility] Address: [address]          [place], [place] [address] [place] Phone: [phone] Relation: Daughter Secondary Emergency Contact: Salerno,Dottie Address: [address]          [place], [place] [address] [place] Home Phone: [phone] Mobile Phone: [phone] Relation: Friend  Code Status: DNR  Allergies: Review of patient's allergies indicates no known allergies.  Chief Complaint  Patient presents with  . Acute Visit    HPI: Patient is 73 y.o. male who is being seen in f/u for daily diarrhea and a conversation with hospice.  Past Medical History  Diagnosis Date  . Pancreatic disorder   . Cardiomyopathy, dilated   . Hypertension 11/29/2013  . Protein-calorie malnutrition, severe 12/01/2013  . High cholesterol 11/2013  . Chronic combined systolic and diastolic CHF (congestive heart failure):  Echo 11/29/13:  EF 15%, Grade two diastolic disfunction. 11/28/2013  . Type II diabetes mellitus   . Lung nodule     "incidental, >3 mm and < 8mm" (12/04/2013)  . Pleural effusion   . Femoral neck fracture 12/04/2013    "left; fell at nursing home today"     Past Surgical History  Procedure Laterality Date  . Tonsillectomy    . Hip arthroplasty Left 12/04/2013    Procedure: ARTHROPLASTY BIPOLAR HIP;  Surgeon: Shelda Pal, MD;  [facility];  Service: Orthopedics;  Laterality: Left;      Medication List       This list is accurate as of: 01/16/14  7:37 PM.  Always use your most recent med list.               bisacodyl 10 MG suppository  Commonly known as:  DULCOLAX  Place 10 mg rectally daily as needed for moderate constipation.     carvedilol 3.125 MG tablet  Commonly  known as:  COREG  Take 1 tablet (3.125 mg total) by mouth 2 (two) times daily with a meal.     feeding supplement (ENSURE COMPLETE) Liqd  Take 237 mLs by mouth 2 (two) times daily between meals.     ferrous sulfate 325 (65 FE) MG tablet  Take 1 tablet (325 mg total) by mouth daily with breakfast.     FLUoxetine 20 MG capsule  Commonly known as:  PROZAC  Take 20 mg by mouth daily.     furosemide 20 MG tablet  Commonly known as:  LASIX  Take 1 tablet (20 mg total) by mouth daily.     gabapentin 100 MG capsule  Commonly known as:  NEURONTIN  Take 100 mg by mouth 3 (three) times daily.     glipiZIDE 10 MG tablet  Commonly known as:  GLUCOTROL  Take 0.5 tablets (5 mg total) by mouth 2 (two) times daily before a meal.     lipase/protease/amylase 44034 UNITS Cpep capsule  Commonly known as:  CREON-12/PANCREASE  Take 1 capsule by mouth 3 (three) times daily with meals.     lisinopril 5 MG tablet  Commonly known as:  PRINIVIL,ZESTRIL  Take 0.5 tablets (2.5 mg total) by mouth daily.     multivitamin with minerals tablet  Take 1 tablet by mouth daily.     ondansetron 4 MG tablet  Commonly known as:  ZOFRAN  Take 4 mg by mouth every 8 (eight) hours as needed for nausea or vomiting.     polyethylene glycol packet  Commonly known as:  MIRALAX / GLYCOLAX  Take 17 g by mouth daily as needed for mild constipation.     simvastatin 20 MG tablet  Commonly known as:  ZOCOR  Take 1 tablet (20 mg total) by mouth daily at 6 PM.     traMADol 50 MG tablet  Commonly known as:  ULTRAM  Take 1 tablet (50 mg total) by mouth every 6 (six) hours as needed for severe pain.     Vitamin D-3 1000 UNITS Caps  Take 1,000 Units by mouth daily.        No orders of the defined types were placed in this encounter.     There is no immunization history on file for this patient.  History  Substance Use Topics  . Smoking status: Former Smoker -- 1.00 packs/day for 57 years    Types: Cigarettes     Quit date: 11/28/2013  . Smokeless tobacco: Never Used  . Alcohol Use: 0.0 oz/week     Comment: 12/04/2013 "quit drinking in 02/2013"    Review of Systems  DATA OBTAINED: from patient, nurse GENERAL: Feels well no fevers, fatigue, appetite changes SKIN: No itching, rash HEENT: No complaint RESPIRATORY: No cough, wheezing, SOB CARDIAC: No chest pain, palpitations, lower extremity edema  GI: No abdominal pain, No N/V; No heartburn or reflux ; diarrhea has stopped GU: No dysuria, frequency or urgency, or incontinence  MUSCULOSKELETAL: No unrelieved bone/joint pain NEUROLOGIC: No headache, dizziness PSYCHIATRIC: No overt anxiety or sadness. Sleeps well according to nursing even though family thinks he doesn't.   Filed Vitals:   01/16/14 1327  BP: 104/64  Pulse: 86  Temp: 97 F (36.1 C)  Resp: 18    Physical Exam  GENERAL APPEARANCE: Alert, conversant. Appropriately groomed. No acute distress  SKIN: No diaphoresis rash HEENT: Unremarkable RESPIRATORY: Breathing is even, unlabored. Lung sounds are clear   CARDIOVASCULAR: Heart RRR no murmurs, rubs or gallops. No peripheral edema  GASTROINTESTINAL: Abdomen is soft, non-tender, not distended w/ normal bowel sounds.  GENITOURINARY: Bladder non tender, not distended  MUSCULOSKELETAL: No abnormal joints or musculature NEUROLOGIC: Cranial nerves 2-12 grossly intact. Moves all extremities no tremor. PSYCHIATRIC: Mood and affect appropriate to situation, no behavioral issues  Patient Active Problem List   Diagnosis Date Noted  . Palliative care encounter 12/10/2013  . DNR (do not resuscitate) 12/10/2013  . Dementia without behavioral disturbance 12/09/2013  . Acute blood loss anemia 12/09/2013  . Tardive dyskinesia 12/08/2013  . Tobacco abuse 12/08/2013  . Acute respiratory failure 12/05/2013  . Hip fracture, left 12/04/2013  . Hypotension 12/04/2013  . Preoperative cardiovascular examination 12/04/2013  . Protein-calorie  malnutrition, severe 12/01/2013  . Pleural effusion 11/30/2013  . Incidental lung nodule, > 13mm and < 34mm 11/30/2013  . Acute systolic CHF (congestive heart failure), NYHA class 2 11/29/2013  . Hypertension 11/29/2013  . Type 2 diabetes, uncontrolled, with neuropathy 11/29/2013  . 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 Weakness generalized 11/29/2013  . SOB (shortness of breath) 11/29/2013  . Chronic combined systolic and diastolic CHF (congestive heart failure):  Echo 11/29/13:  EF 15%, Grade two diastolic disfunction. 11/28/2013    CBC    Component Value Date/Time   WBC 9.2 12/08/2013 2107   RBC 2.66* 12/08/2013 2107   HGB 8.2* 12/08/2013 2107  HCT 22.8* 12/08/2013 2107   PLT 227 12/08/2013 2107   MCV 85.7 12/08/2013 2107   LYMPHSABS 1.0 12/04/2013 1217   MONOABS 0.4 12/04/2013 1217   EOSABS 0.1 12/04/2013 1217   BASOSABS 0.0 12/04/2013 1217    CMP     Component Value Date/Time   NA 130* 12/09/2013 0640   K 4.7 12/09/2013 0640   CL 93* 12/09/2013 0640   CO2 25 12/09/2013 0640   GLUCOSE 189* 12/09/2013 0640   BUN 23 12/09/2013 0640   CREATININE 0.67 12/09/2013 0640   CALCIUM 8.5 12/09/2013 0640   PROT 6.4 03/31/2013 1630   ALBUMIN 3.1* 03/31/2013 1630   AST 40* 03/31/2013 1630   ALT 34 03/31/2013 1630   ALKPHOS 142* 03/31/2013 1630   BILITOT 0.4 03/31/2013 1630   GFRNONAA >90 12/09/2013 0640   GFRAA >90 12/09/2013 0640    Assessment and Plan  Chronic combined systolic and diastolic CHF (congestive heart failure):  Echo 11/29/13:  EF 15%, Grade two diastolic disfunction. Pt and family have signed on with Hospice. They would like to stop the daily weights, which I agreed to. Apparently there will be no work-ups for pt any longer. Today pt was in bed and seemed weaker than even 5 days ago but at peace.  DIARRHEA- pt's stool cx came back today; there was no infection, no c. Diff but lactoferrin was positive, there was no level. The next step typically might be endoscopy but  that will not be done. Pt doesn't want it nor would he be able to withstand treatments if a dx was made. Hoever, with neg bacterial cx we can now use immodium for diarrhea if needed and he of course does not need any further treatment.  Margit HanksAnne D Eletha Culbertson, MD

## 2014-01-16 NOTE — Assessment & Plan Note (Addendum)
Pt and family have signed on with Hospice. I spoke with Hospice about the pt and some particulars including that they would like to stop the daily weights, which I agreed to. Apparently there will be no work-ups for pt any longer. Today pt was in bed and seemed weaker than even 5 days ago but at peace.

## 2014-01-28 ENCOUNTER — Non-Acute Institutional Stay (SKILLED_NURSING_FACILITY): Payer: Medicare Other | Admitting: Internal Medicine

## 2014-01-28 ENCOUNTER — Encounter: Payer: Self-pay | Admitting: Internal Medicine

## 2014-01-28 DIAGNOSIS — S72009A Fracture of unspecified part of neck of unspecified femur, initial encounter for closed fracture: Secondary | ICD-10-CM

## 2014-01-28 DIAGNOSIS — R6883 Chills (without fever): Secondary | ICD-10-CM

## 2014-01-28 DIAGNOSIS — S72002A Fracture of unspecified part of neck of left femur, initial encounter for closed fracture: Secondary | ICD-10-CM

## 2014-01-28 NOTE — Progress Notes (Signed)
Patient ID: [name] [name], male   DOB: 1940/12/30, 73 y.o.   MRN: [id number]   This is an acute visit.  Level care skilled.  Facility AF.  Chief complaint acute visit followup left hip fracture issues-intermittent chills.  History of present illness.  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  He is under hospice care with family desires for essentially comfort measures.  Apparently on May 29 he did have a followup appointment with orthopedics despite being on comfort care-per review of consult note as well as discussion with his responsible party who is well aware of the visit orthopedics stated that apparently the hip repair may need revision and becoming apparently loose-.  However once orthopedics was made aware patient's hospice comfort status-desire was not to proceed with this.  I am following up on this with his responsible party--who is at his bedside today - certainly reiterated this is her desire--pain management being a priority.  Currently he does not complaining of any acute pain apparently occasionally will complain of some discomfort-apparently Tylenol is effective he also has tramadol if needed.  His daughter who is his responsible party says he has complained of some chills intermittently the past week or so she says this appears to be increasing.  I noted  he recently completed an antibiotic for UTI--he has been afebrile and has not really complained of dysuria.  Family medical social history as been reviewed per discharge summary on 12/10/2013.  Medications have been reviewed per MAR.  Review of systems.  Somewhat limited since patient is a poor historian.  Does complain of chills per responsible party has no fever.  Respiratory does not complaining of shortness of breath or cough.  Cardiac no chest pain no edema.  GI  does not complaining of abdominal discomfort.  GU does not complaining of any dysuria recently treated for UTI.  Muscle skeletal occasionally will complain of some left hip discomfort but this is not persistent apparently Tylenol relieves this most of the time.  Neurologic does complain of some peripheral neuropathy symptoms intermittent tingling of his lower extremities.  Psych has a history of dementia.  Physical exam.  Temperature is 97.0 pulse 76 respirations 18 blood pressure 103/75.  In general this is a frail elderly male in no distress sitting comfortably in his wheelchair.  Skin is warm and dry.  Oropharynx clear mucous membranes moist.  Neck could not appreciate any adenopathy.  Chest is clear to auscultation with somewhat shallow air entry no labored breathing.  Heart is distant heart sounds regular rate and rhythm there is no lower extremity edema.  Abdomen is soft nontender with positive bowel sounds.  GU could not really appreciate any suprapubic tenderness.  Muscle skeletal general frailty with limited movement of his lower extremities again he does have a history of left hip fracture-limited exam secondary to patient positioning in wheelchair.  Neurologic grossly intact although sensation lower extremities appears somewhat impaired suspect some history of peripheral neuropathy  Labs.  01/08/2014.  Sodium 124 potassium 4.8 BUN 11 creatinine 0.5.  CBC 7.2 hemoglobin 9.2 platelets 471.  Urine culture may 22nd 2015 grew out enterococcus sensitive to ampicillin  Assessment and plan.  #1-history of left hip fracture-apparently there's been some loosening of the fixation-family does not desire any revision in orthopedics apparently is aware of this will write an order for no  further orthopedic consult per family request for essentially comfort care-continue to monitor pain at this point Tylenol appears largely effective he does have when necessary tramadol-per  discussion with his responsible party apparently there was one medication that made him quite confused and I see that Vicodin had been discontinued about a month ago I suspect thatwas the reason  #2  history of intermittent chills-at this point we'll order a UA C&S for followup-also a TSH CBC and CMP this was discussed with his responsible party who was at bedside--   [phone]

## 2014-02-04 ENCOUNTER — Encounter: Payer: Self-pay | Admitting: Internal Medicine

## 2014-02-04 ENCOUNTER — Non-Acute Institutional Stay (SKILLED_NURSING_FACILITY): Payer: Medicare Other | Admitting: Internal Medicine

## 2014-02-04 DIAGNOSIS — F03918 Unspecified dementia, unspecified severity, with other behavioral disturbance: Secondary | ICD-10-CM | POA: Insufficient documentation

## 2014-02-04 DIAGNOSIS — IMO0002 Reserved for concepts with insufficient information to code with codable children: Secondary | ICD-10-CM

## 2014-02-04 DIAGNOSIS — F0391 Unspecified dementia with behavioral disturbance: Secondary | ICD-10-CM | POA: Insufficient documentation

## 2014-02-04 DIAGNOSIS — R451 Restlessness and agitation: Secondary | ICD-10-CM

## 2014-02-04 NOTE — Progress Notes (Signed)
Patient ID: [name] [name], male   DOB: [birth year], 73 y.o.   MRN: [id number]   This is an acute visit.  Level care skilled.  Facility ALF.  Chief complaint-acute visit secondary to behaviors with history of dementia.  History of present illness.  Patient is a 73 year old male with a history of recent left hip fracture as well as CHF.  He is under hospice care with family desire for essentially comfort measures.  He does have a listed history of dementia currently is on Prozac for what I suspect is associated depression.  His daughter states that over the past few days he appears to have become increasingly agitated at times-apparently he is having trouble sleeping.  I did see him last week for orthopedic issue that family does not want any followup on-also ordered lab work including a metabolic panel CBC and TSH as well as a urine secondary to some complaints of chills.  Of note any initiation of medicines has been done somewhat conservatively secondary to some previous history of confusion with certainrs pain medications-like Norco Vicodin-  His vital signs remained stable he is not really complaining of dysuria or any chest pain or discomfort-but his daughter has noted he's become increasingly agitated frustrated.  She says he appears to become frustrated when he's trying to associate things from his past and he has trouble recalling it...  Family medical social history as been reviewed per discharge summary on 12/10/2013  Medications have been reviewed per [facility].  Review of systems-.  In general he did not complain of fever or chills today.  Head ears eyes nose mouth and throat-no complaints of visual changes or headache or sore throat.  Respiratory does not complaining of shortness of breath or cough.  Cardiac extensive history CHF but does not complaining of chest pain.  GI no complaints of abdominal discomfort or nausea or vomiting apparently he did have an episode  possibly of diarrhea this weekend.  GU does not complain of dysuria.  Muscle skeletal is not complaining of joint pain.  Neurologic again is not complaining of any headache or dizziness.  Psych he does have some listed history of dementia as well as I suspect depression he is on Prozac.  Physical exam.  Temperature is 97.0 pulse 77 respirations 17 blood pressure 108/78.  In general this is a frail elderly male in no distress sitting comfortably in his wheelchair although he does appear somewhat agitated and confused.  His skin is warm and dry.  His oropharynx is clear mucous membranes moist.  Chest is clear to auscultation no labored breathing somewhat shallow air entry.  Heart continues to have distant heart sounds regular rate and rhythm without significant lower extremity edema.  Abdomen is soft does not appear to be significantly tender there are positive bowel sounds.  GU does not really have any suprapubic tenderness.  Muscle skeletal has general frailty with limited movement of his lower extremities at baseline moves upper extremities with decent grip strength  Neurologic-appears grossly intact I do not see any lateralizing findings his speech is clear although he is somewhat confused.  Psych he is oriented to self-however appears somewhat agitated-- when asked why he states I don't think anybody's on my side but cannot really elaborate why he feels  this way  Labs.  The third 2015.  Sodium 137 potassium 4.6 BUN 14 creatinine 0.6.  Liver function tests within normal limits except albumin of 3.4.  Assessment and plan.  #1-dementia with some behaviors agitation-I  did discuss this fairly extensively with his daughter at bedside-we'll start low dose Ativan 0.25 mg every 8 hours when necessary-also we'll order a psychiatric consult  If dementia-agitation  is progressing would benefit from their input possibly initiation of Depakote as a mood stabilize--- monitor vital  signs and pulse ox every shift as well.  Also will write order to try to obtain previously ordered lab work including the CBC TSH and urine results   At this point patient appears clinically stable but certainly does appear more agitated when when I saw him last week   [id number]-of note greater than 25 minutes spent assessing patient-discussing his status with his daughter in the room-and coordinating her plan of care with family input-of note greater than 50% time spent coordinating plan of care

## 2014-02-18 ENCOUNTER — Other Ambulatory Visit: Payer: Self-pay | Admitting: *Deleted

## 2014-02-18 MED ORDER — MORPHINE SULFATE (CONCENTRATE) 20 MG/ML PO SOLN: ORAL | Status: AC

## 2014-02-18 NOTE — Telephone Encounter (Signed)
[facility] 

## 2014-03-06 ENCOUNTER — Non-Acute Institutional Stay (SKILLED_NURSING_FACILITY): Payer: Medicare Other | Admitting: Internal Medicine

## 2014-03-06 DIAGNOSIS — R5381 Other malaise: Secondary | ICD-10-CM

## 2014-03-06 DIAGNOSIS — B001 Herpesviral vesicular dermatitis: Secondary | ICD-10-CM

## 2014-03-06 DIAGNOSIS — B009 Herpesviral infection, unspecified: Secondary | ICD-10-CM

## 2014-03-06 DIAGNOSIS — R531 Weakness: Secondary | ICD-10-CM

## 2014-03-06 DIAGNOSIS — R5383 Other fatigue: Secondary | ICD-10-CM

## 2014-03-06 DIAGNOSIS — E43 Unspecified severe protein-calorie malnutrition: Secondary | ICD-10-CM

## 2014-03-06 DIAGNOSIS — I5042 Chronic combined systolic (congestive) and diastolic (congestive) heart failure: Secondary | ICD-10-CM

## 2014-03-06 DIAGNOSIS — I509 Heart failure, unspecified: Secondary | ICD-10-CM

## 2014-03-08 ENCOUNTER — Non-Acute Institutional Stay (SKILLED_NURSING_FACILITY): Payer: Medicare Other | Admitting: Internal Medicine

## 2014-03-08 ENCOUNTER — Encounter: Payer: Self-pay | Admitting: Internal Medicine

## 2014-03-08 DIAGNOSIS — F0391 Unspecified dementia with behavioral disturbance: Secondary | ICD-10-CM

## 2014-03-08 DIAGNOSIS — E1165 Type 2 diabetes mellitus with hyperglycemia: Secondary | ICD-10-CM

## 2014-03-08 DIAGNOSIS — S72002D Fracture of unspecified part of neck of left femur, subsequent encounter for closed fracture with routine healing: Secondary | ICD-10-CM

## 2014-03-08 DIAGNOSIS — R5383 Other fatigue: Secondary | ICD-10-CM

## 2014-03-08 DIAGNOSIS — I509 Heart failure, unspecified: Secondary | ICD-10-CM

## 2014-03-08 DIAGNOSIS — IMO0002 Reserved for concepts with insufficient information to code with codable children: Secondary | ICD-10-CM

## 2014-03-08 DIAGNOSIS — E1142 Type 2 diabetes mellitus with diabetic polyneuropathy: Secondary | ICD-10-CM

## 2014-03-08 DIAGNOSIS — F03918 Unspecified dementia, unspecified severity, with other behavioral disturbance: Secondary | ICD-10-CM

## 2014-03-08 DIAGNOSIS — E43 Unspecified severe protein-calorie malnutrition: Secondary | ICD-10-CM

## 2014-03-08 DIAGNOSIS — S72009D Fracture of unspecified part of neck of unspecified femur, subsequent encounter for closed fracture with routine healing: Secondary | ICD-10-CM

## 2014-03-08 DIAGNOSIS — E1149 Type 2 diabetes mellitus with other diabetic neurological complication: Secondary | ICD-10-CM

## 2014-03-08 DIAGNOSIS — R531 Weakness: Secondary | ICD-10-CM

## 2014-03-08 DIAGNOSIS — I5042 Chronic combined systolic (congestive) and diastolic (congestive) heart failure: Secondary | ICD-10-CM

## 2014-03-08 DIAGNOSIS — R911 Solitary pulmonary nodule: Secondary | ICD-10-CM

## 2014-03-08 DIAGNOSIS — R5381 Other malaise: Secondary | ICD-10-CM

## 2014-03-08 DIAGNOSIS — E1140 Type 2 diabetes mellitus with diabetic neuropathy, unspecified: Secondary | ICD-10-CM

## 2014-03-08 NOTE — Progress Notes (Signed)
MRN: [id number] Name: [name] [name]  Sex: male Age: 73 y.o. DOB: August 10, 1941  PSC #: [facility] Facility/Room: 205 Level Of Care: SNF Provider: Merrilee Seashore D Emergency Contacts: Extended Emergency Contact Information Primary Emergency Contact: [facility] Address: [address]          [place], [place] [address] Darden Amber of [place] Phone: [phone] Relation: Daughter Secondary Emergency Contact: Salerno,Dottie Address: [address]          [place], [place] [address] Darden Amber of [place] Home Phone: [phone] Mobile Phone: [phone] Relation: Friend  Code Status: DNR  Allergies: Review of patient's allergies indicates no known allergies.  Chief Complaint  Patient presents with  . Acute Visit    HPI: Patient is 73 y.o. male who has just been made pallative/hospice care who I am seeing to speak to family about some concerns about SNF and also for sweliing lips not there yesterday.   Past Medical History  Diagnosis Date  . Pancreatic disorder   . Cardiomyopathy, dilated   . Hypertension 11/29/2013  . Protein-calorie malnutrition, severe 12/01/2013  . High cholesterol 11/2013  . Chronic combined systolic and diastolic CHF (congestive heart failure):  Echo 11/29/13:  EF 15%, Grade two diastolic disfunction. 11/28/2013  . Type II diabetes mellitus   . Lung nodule     "incidental, >3 mm and < 56mm" (12/04/2013)  . Pleural effusion   . Femoral neck fracture 12/04/2013    "left; fell at nursing home today"     Past Surgical History  Procedure Laterality Date  . Tonsillectomy    . Hip arthroplasty Left 12/04/2013    Procedure: ARTHROPLASTY BIPOLAR HIP;  Surgeon: Shelda Pal, MD;  Location: [facility] OR;  Service: Orthopedics;  Laterality: Left;      Medication List       This list is accurate as of: 03/06/14 11:59 PM.  Always use your most recent med list.               bisacodyl 10 MG suppository  Commonly known as:  DULCOLAX  Place 10 mg rectally daily as  needed for moderate constipation.     carvedilol 3.125 MG tablet  Commonly known as:  COREG  Take 1 tablet (3.125 mg total) by mouth 2 (two) times daily with a meal.     feeding supplement (ENSURE COMPLETE) Liqd  Take 237 mLs by mouth 2 (two) times daily between meals.     ferrous sulfate 325 (65 FE) MG tablet  Take 1 tablet (325 mg total) by mouth daily with breakfast.     FLUoxetine 20 MG capsule  Commonly known as:  PROZAC  Take 20 mg by mouth daily.     furosemide 20 MG tablet  Commonly known as:  LASIX  Take 1 tablet (20 mg total) by mouth daily.     gabapentin 100 MG capsule  Commonly known as:  NEURONTIN  Take 100 mg by mouth 3 (three) times daily.     glipiZIDE 10 MG tablet  Commonly known as:  GLUCOTROL  Take 0.5 tablets (5 mg total) by mouth 2 (two) times daily before a meal.     lipase/protease/amylase 16606 UNITS Cpep capsule  Commonly known as:  CREON-12/PANCREASE  Take 1 capsule by mouth 3 (three) times daily with meals.     lisinopril 5 MG tablet  Commonly known as:  PRINIVIL,ZESTRIL  Take 0.5 tablets (2.5 mg total) by mouth daily.     LORazepam 0.5 MG tablet  Commonly known  as:  ATIVAN  Take 0.25 mg by mouth every 8 (eight) hours as needed for anxiety.     morphine 20 MG/ML concentrated solution  Commonly known as:  ROXANOL  Give 0.5425ml sublingual every 8 hours; Give 0.3525ml sublingual every 2 hours as needed for pain     multivitamin with minerals tablet  Take 1 tablet by mouth daily.     ondansetron 4 MG tablet  Commonly known as:  ZOFRAN  Take 4 mg by mouth every 8 (eight) hours as needed for nausea or vomiting.     polyethylene glycol packet  Commonly known as:  MIRALAX / GLYCOLAX  Take 17 g by mouth daily as needed for mild constipation.     simvastatin 20 MG tablet  Commonly known as:  ZOCOR  Take 1 tablet (20 mg total) by mouth daily at 6 PM.     traMADol 50 MG tablet  Commonly known as:  ULTRAM  Take 1 tablet (50 mg total) by mouth  every 6 (six) hours as needed for severe pain.     Vitamin D-3 1000 UNITS Caps  Take 1,000 Units by mouth daily.        No orders of the defined types were placed in this encounter.     There is no immunization history on file for this patient.  History  Substance Use Topics  . Smoking status: Former Smoker -- 1.00 packs/day for 57 years    Types: Cigarettes    Quit date: 11/28/2013  . Smokeless tobacco: Never Used  . Alcohol Use: 0.0 oz/week     Comment: 12/04/2013 "quit drinking in 02/2013"    Review of Systems  DATA OBTAINED: from patient, nurse, family member GENERAL: Feels well no fevers,+ fatigue, appetite changes SKIN: No itching, rash HEENT: No complaint RESPIRATORY: No cough, wheezing, SOB CARDIAC: No chest pain, palpitations, lower extremity edema  GI: No abdominal pain, No N/V/D or constipation, No heartburn or reflux  GU: No dysuria, frequency or urgency, or incontinence  MUSCULOSKELETAL: No unrelieved bone/joint pain NEUROLOGIC: No headache, dizziness or  PSYCHIATRIC:. Sleeps OK   Filed Vitals:   03/09/14 1524  BP: 130/80  Pulse: 70  Temp: 97.7 F (36.5 C)  Resp: 18    Physical Exam  GENERAL APPEARANCE: Alert, conversant. Appropriately groomed. No acute distress  SKIN: No diaphoresis rash, or wounds HEENT: lips with several ulcers on mucous membranes upper and lower causing swelling RESPIRATORY: Breathing is even, unlabored. Lung sounds are clear   CARDIOVASCULAR: Heart RRR no murmurs, rubs or gallops. No peripheral edema  GASTROINTESTINAL: Abdomen is soft, non-tender, not distended w/ normal bowel sounds.  GENITOURINARY: Bladder non tender, not distended  MUSCULOSKELETAL: No abnormal joints or musculature NEUROLOGIC: Cranial nerves 2-12 grossly intact. Moves all extremities no tremor. PSYCHIATRIC: Mood and affect appropriate to situation, no behavioral issues  Patient Active Problem List   Diagnosis Date Noted  . Dementia with behavioral  problem 02/04/2014  . Chills 01/28/2014  . Palliative care encounter 12/10/2013  . DNR (do not resuscitate) 12/10/2013  . Dementia without behavioral disturbance 12/09/2013  . Acute blood loss anemia 12/09/2013  . Tardive dyskinesia 12/08/2013  . Tobacco abuse 12/08/2013  . Acute respiratory failure 12/05/2013  . Hip fracture, left 12/04/2013  . Hypotension 12/04/2013  . Preoperative cardiovascular examination 12/04/2013  . Protein-calorie malnutrition, severe 12/01/2013  . Pleural effusion 11/30/2013  . Incidental lung nodule, > 3mm and < 8mm 11/30/2013  . Acute systolic CHF (congestive heart failure), NYHA class 2  11/29/2013  . Hypertension 11/29/2013  . Type 2 diabetes, uncontrolled, with neuropathy 11/29/2013  . 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 Weakness generalized 11/29/2013  . SOB (shortness of breath) 11/29/2013  . Chronic combined systolic and diastolic CHF (congestive heart failure):  Echo 11/29/13:  EF 15%, Grade two diastolic disfunction. 11/28/2013    CBC    Component Value Date/Time   WBC 9.2 12/08/2013 2107   RBC 2.66* 12/08/2013 2107   HGB 8.2* 12/08/2013 2107   HCT 22.8* 12/08/2013 2107   PLT 227 12/08/2013 2107   MCV 85.7 12/08/2013 2107   LYMPHSABS 1.0 12/04/2013 1217   MONOABS 0.4 12/04/2013 1217   EOSABS 0.1 12/04/2013 1217   BASOSABS 0.0 12/04/2013 1217    CMP     Component Value Date/Time   NA 130* 12/09/2013 0640   K 4.7 12/09/2013 0640   CL 93* 12/09/2013 0640   CO2 25 12/09/2013 0640   GLUCOSE 189* 12/09/2013 0640   BUN 23 12/09/2013 0640   CREATININE 0.67 12/09/2013 0640   CALCIUM 8.5 12/09/2013 0640   PROT 6.4 03/31/2013 1630   ALBUMIN 3.1* 03/31/2013 1630   AST 40* 03/31/2013 1630   ALT 34 03/31/2013 1630   ALKPHOS 142* 03/31/2013 1630   BILITOT 0.4 03/31/2013 1630   GFRNONAA >90 12/09/2013 0640   GFRAA >90 12/09/2013 0640    Assessment and Plan   HERPES LABIALIS - called Onalee Hua at hospice at request of family; will use Valtrex  2000mg  po now and again in 12 hours. This is considered comfort care  Chronic combined systolic and diastolic CHF (congestive heart failure):  Echo 11/29/13:  EF 15%, Grade two diastolic disfunction. Progressing  Protein-calorie malnutrition, severe Continues;comfort feeds  Weakness generalized Supportive care as needed   Pt was seen and tx 03/06/2014 Margit Hanks, MD

## 2014-03-09 ENCOUNTER — Encounter: Payer: Self-pay | Admitting: Internal Medicine

## 2014-03-09 NOTE — Assessment & Plan Note (Signed)
Supportive care as needed

## 2014-03-09 NOTE — Assessment & Plan Note (Signed)
Progressing

## 2014-03-09 NOTE — Assessment & Plan Note (Signed)
Continues;comfort feeds

## 2014-03-10 ENCOUNTER — Encounter: Payer: Self-pay | Admitting: Internal Medicine

## 2014-03-10 NOTE — Progress Notes (Signed)
MRN: [id number] Name: [name] [name]  Sex: male Age: 73 y.o. DOB: 1940-09-23  [facility] #: [facility] Facility/Room: 205 Level Of Care: SNF Provider: Merrilee Seashore D Emergency Contacts: Extended Emergency Contact Information Primary Emergency Contact: [facility] Address: [address]          [place], [place] [address] Darden Amber of [place] Phone: [phone] Relation: Daughter Secondary Emergency Contact: Salerno,Dottie Address: [address]          [place], [place] [address] Darden Amber of [place] Home Phone: [phone] Mobile Phone: [phone] Relation: Friend  Code Status: DNR  Allergies: Review of patient's allergies indicates no known allergies.  Chief Complaint  Patient presents with  . Discharge Note    HPI: Patient is 73 y.o. male with end stage CHF who is being discharged to [facility]  Past Medical History  Diagnosis Date  . Pancreatic disorder   . Cardiomyopathy, dilated   . Hypertension 11/29/2013  . Protein-calorie malnutrition, severe 12/01/2013  . High cholesterol 11/2013  . Chronic combined systolic and diastolic CHF (congestive heart failure):  Echo 11/29/13:  EF 15%, Grade two diastolic disfunction. 11/28/2013  . Type II diabetes mellitus   . Lung nodule     "incidental, >3 mm and < 8mm" (12/04/2013)  . Pleural effusion   . Femoral neck fracture 12/04/2013    "left; fell at nursing home today"     Past Surgical History  Procedure Laterality Date  . Tonsillectomy    . Hip arthroplasty Left 12/04/2013    Procedure: ARTHROPLASTY BIPOLAR HIP;  Surgeon: Shelda Pal, MD;  Location: [facility] OR;  Service: Orthopedics;  Laterality: Left;      Medication List       This list is accurate as of: 03/08/14 11:59 PM.  Always use your most recent med list.               bisacodyl 10 MG suppository  Commonly known as:  DULCOLAX  Place 10 mg rectally daily as needed for moderate constipation.     carvedilol 3.125 MG tablet  Commonly known as:  COREG  Take  1 tablet (3.125 mg total) by mouth 2 (two) times daily with a meal.     feeding supplement (ENSURE COMPLETE) Liqd  Take 237 mLs by mouth 2 (two) times daily between meals.     ferrous sulfate 325 (65 FE) MG tablet  Take 1 tablet (325 mg total) by mouth daily with breakfast.     FLUoxetine 20 MG capsule  Commonly known as:  PROZAC  Take 20 mg by mouth daily.     furosemide 20 MG tablet  Commonly known as:  LASIX  Take 1 tablet (20 mg total) by mouth daily.     gabapentin 100 MG capsule  Commonly known as:  NEURONTIN  Take 100 mg by mouth 3 (three) times daily.     glipiZIDE 10 MG tablet  Commonly known as:  GLUCOTROL  Take 0.5 tablets (5 mg total) by mouth 2 (two) times daily before a meal.     lipase/protease/amylase 10272 UNITS Cpep capsule  Commonly known as:  CREON-12/PANCREASE  Take 1 capsule by mouth 3 (three) times daily with meals.     lisinopril 5 MG tablet  Commonly known as:  PRINIVIL,ZESTRIL  Take 0.5 tablets (2.5 mg total) by mouth daily.     LORazepam 0.5 MG tablet  Commonly known as:  ATIVAN  Take 0.25 mg by mouth every 8 (eight) hours as needed for anxiety.  morphine 20 MG/ML concentrated solution  Commonly known as:  ROXANOL  Give 0.67ml sublingual every 8 hours; Give 0.63ml sublingual every 2 hours as needed for pain     multivitamin with minerals tablet  Take 1 tablet by mouth daily.     ondansetron 4 MG tablet  Commonly known as:  ZOFRAN  Take 4 mg by mouth every 8 (eight) hours as needed for nausea or vomiting.     polyethylene glycol packet  Commonly known as:  MIRALAX / GLYCOLAX  Take 17 g by mouth daily as needed for mild constipation.     simvastatin 20 MG tablet  Commonly known as:  ZOCOR  Take 1 tablet (20 mg total) by mouth daily at 6 PM.     traMADol 50 MG tablet  Commonly known as:  ULTRAM  Take 1 tablet (50 mg total) by mouth every 6 (six) hours as needed for severe pain.     Vitamin D-3 1000 UNITS Caps  Take 1,000 Units by  mouth daily.        No orders of the defined types were placed in this encounter.     There is no immunization history on file for this patient.  History  Substance Use Topics  . Smoking status: Former Smoker -- 1.00 packs/day for 57 years    Types: Cigarettes    Quit date: 11/28/2013  . Smokeless tobacco: Never Used  . Alcohol Use: 0.0 oz/week     Comment: 12/04/2013 "quit drinking in 02/2013"    Filed Vitals:   03/08/14 1514  BP: 142/91  Pulse: 92  Temp: 97 F (36.1 C)  Resp: 18    Physical Exam  GENERAL APPEARANCE: Alert, conversant. Appropriately groomed. No acute distress.  HEENT: Unremarkable. RESPIRATORY: Breathing is even, unlabored. Lung sounds are clear   CARDIOVASCULAR: Heart RRR no murmurs, rubs or gallops. No peripheral edema.  GASTROINTESTINAL: Abdomen is soft, non-tender, not distended w/ normal bowel sounds.  NEUROLOGIC: Cranial nerves 2-12 grossly intact. Moves all extremities no tremor.  Patient Active Problem List   Diagnosis Date Noted  . Dementia with behavioral problem 02/04/2014  . Chills 01/28/2014  . Palliative care encounter 12/10/2013  . DNR (do not resuscitate) 12/10/2013  . Dementia without behavioral disturbance 12/09/2013  . Acute blood loss anemia 12/09/2013  . Tardive dyskinesia 12/08/2013  . Tobacco abuse 12/08/2013  . Acute respiratory failure 12/05/2013  . Hip fracture, left 12/04/2013  . Hypotension 12/04/2013  . Preoperative cardiovascular examination 12/04/2013  . Protein-calorie malnutrition, severe 12/01/2013  . Pleural effusion 11/30/2013  . Incidental lung nodule, > 3mm and < 69mm 11/30/2013  . Acute systolic CHF (congestive heart failure), NYHA class 2 11/29/2013  . Hypertension 11/29/2013  . Type 2 diabetes, uncontrolled, with neuropathy 11/29/2013  . 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 Weakness generalized 11/29/2013  . SOB (shortness of breath) 11/29/2013  . Chronic combined  systolic and diastolic CHF (congestive heart failure):  Echo 11/29/13:  EF 15%, Grade two diastolic disfunction. 11/28/2013    CBC    Component Value Date/Time   WBC 9.2 12/08/2013 2107   RBC 2.66* 12/08/2013 2107   HGB 8.2* 12/08/2013 2107   HCT 22.8* 12/08/2013 2107   PLT 227 12/08/2013 2107   MCV 85.7 12/08/2013 2107   LYMPHSABS 1.0 12/04/2013 1217   MONOABS 0.4 12/04/2013 1217   EOSABS 0.1 12/04/2013 1217   BASOSABS 0.0 12/04/2013 1217    CMP     Component Value Date/Time  NA 130* 12/09/2013 0640   K 4.7 12/09/2013 0640   CL 93* 12/09/2013 0640   CO2 25 12/09/2013 0640   GLUCOSE 189* 12/09/2013 0640   BUN 23 12/09/2013 0640   CREATININE 0.67 12/09/2013 0640   CALCIUM 8.5 12/09/2013 0640   PROT 6.4 03/31/2013 1630   ALBUMIN 3.1* 03/31/2013 1630   AST 40* 03/31/2013 1630   ALT 34 03/31/2013 1630   ALKPHOS 142* 03/31/2013 1630   BILITOT 0.4 03/31/2013 1630   GFRNONAA >90 12/09/2013 0640   GFRAA >90 12/09/2013 0640    Assessment and Plan  [facility] only 2 medications, roxanol 20/ml and ativan gel 2mg /ml, but I have included pt's prior list of medications FYI. Pt is being followed by Hospice.   Margit HanksALEXANDER, Arlo Buffone D, MD

## 2014-04-30 DEATH — deceased

## 2014-06-16 ENCOUNTER — Encounter: Payer: Self-pay | Admitting: Internal Medicine

## 2016-01-18 IMAGING — CT CT CHEST W/ CM
2 of 4 series · 15 of 36 positions shown, 18 images · IV contrast (Omni 300)
Comparison: Chest radiographs 11/28/2013.

CLINICAL DATA: 72-year-old male with shortness of breath and cough.
Recent weight gain and lower extremity swelling. Pleural effusions.
Initial encounter.

EXAM:
CT CHEST WITH CONTRAST
TECHNIQUE: Multidetector CT imaging of the chest was performed during
intravenous contrast administration.
CONTRAST:  75mL OMNIPAQUE IOHEXOL 300 MG/ML  SOLN

[Series 2: thorax 5.0 i31f 1 · axial · 0.75mm/px · z∈[-933,-613]mm · 12 of 74 slices shown, 15 images]
[im 5/74  mediastinal]
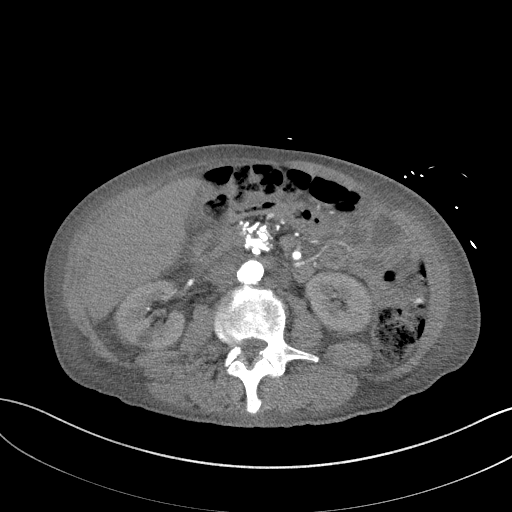
[im 5/74  lung]
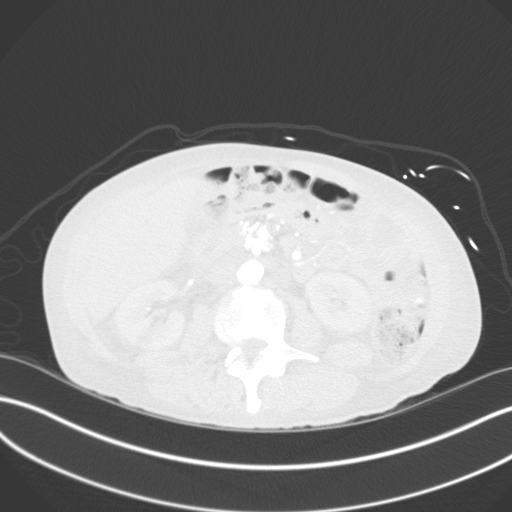
[im 10/74  lung]
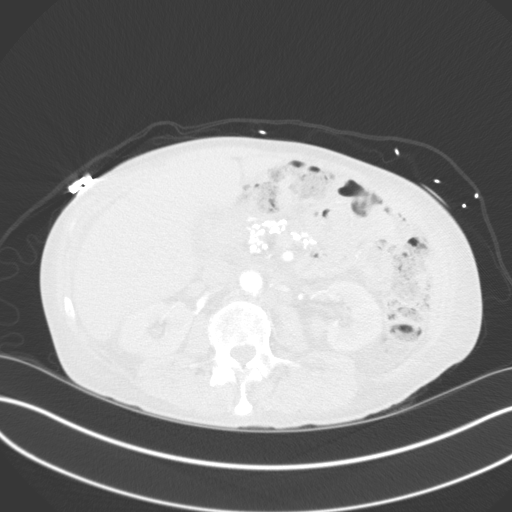
[im 15/74  lung]
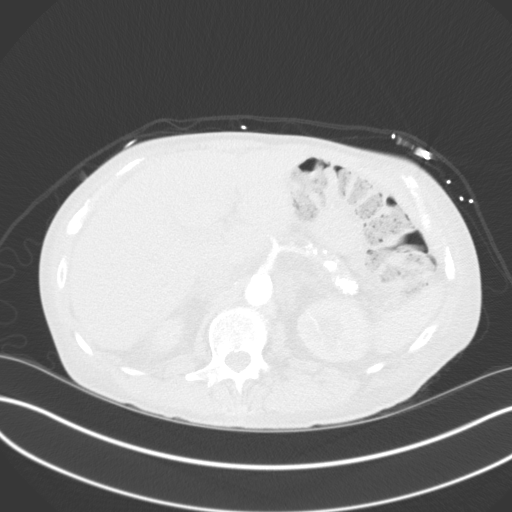
[im 25/74  lung]
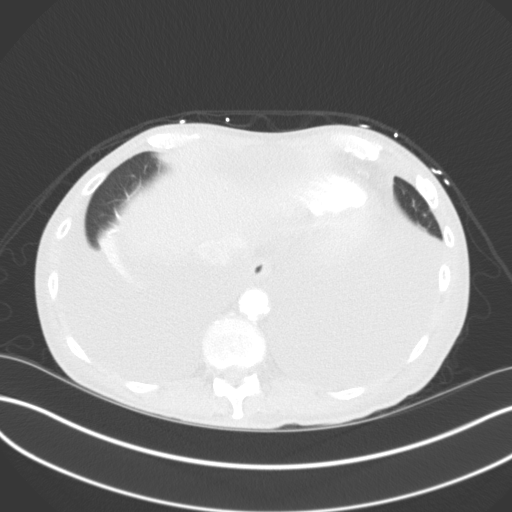
[im 30/74  mediastinal]
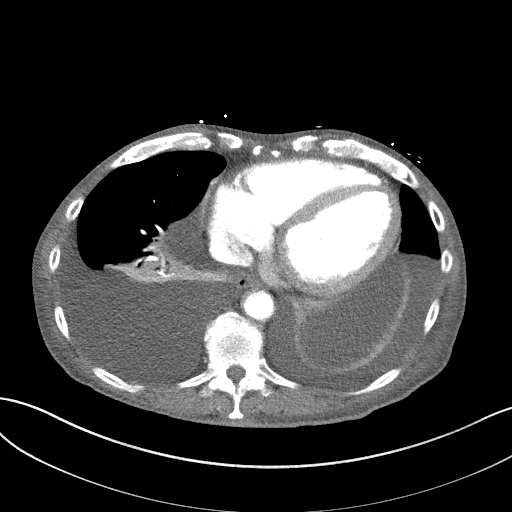
[im 30/74  lung]
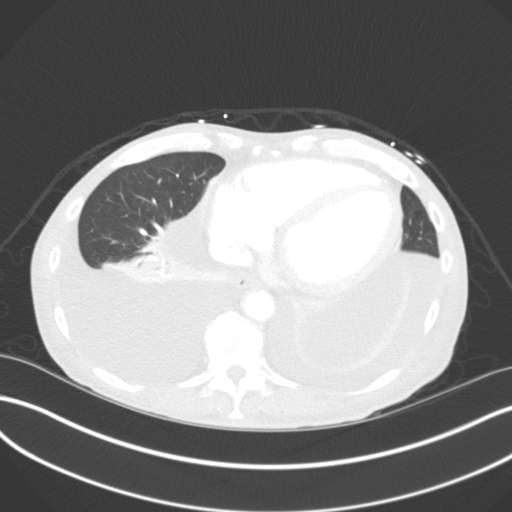
[im 35/74  lung]
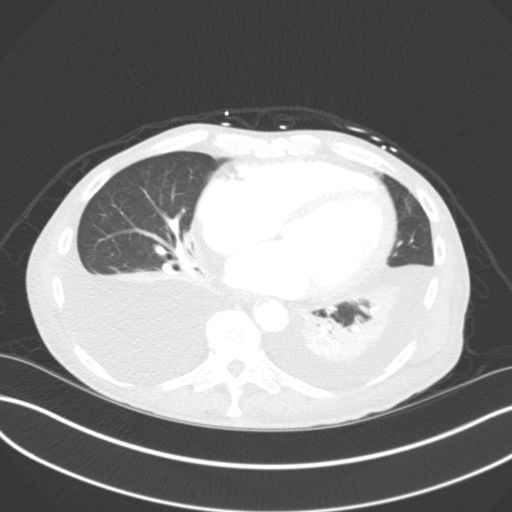
[im 39/74  lung]
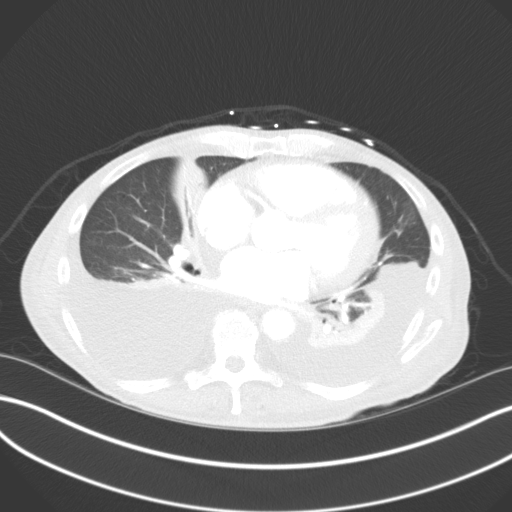
[im 44/74  lung]
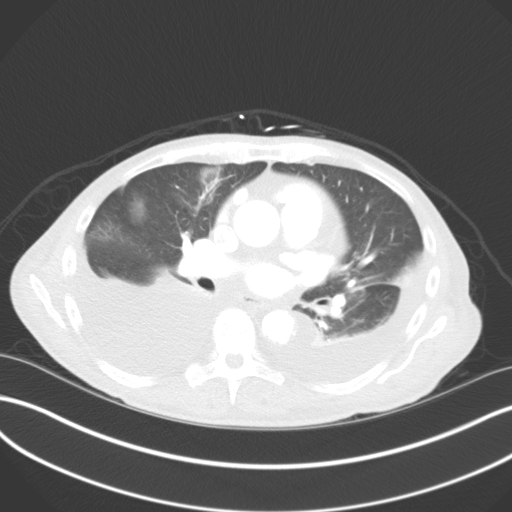
[im 49/74  mediastinal]
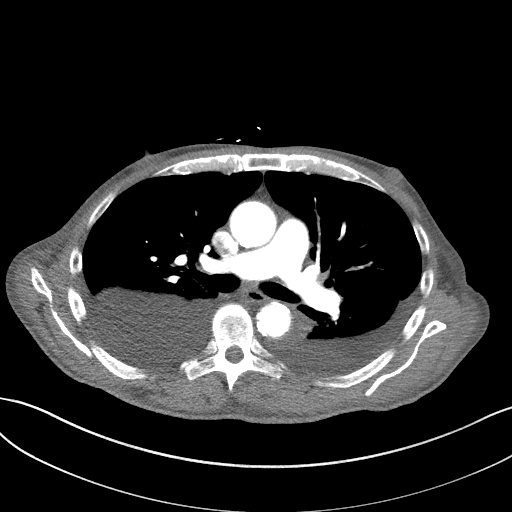
[im 49/74  lung]
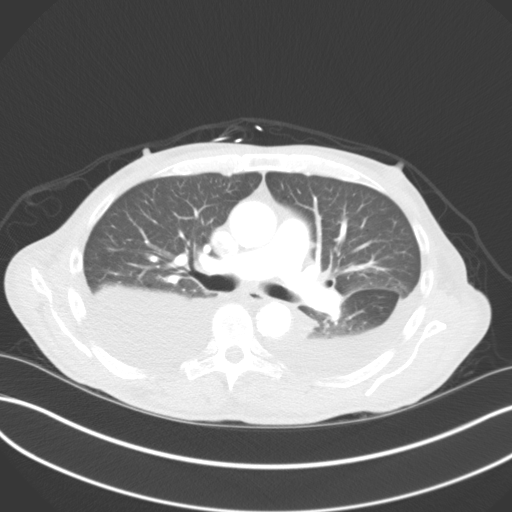
[im 59/74  lung]
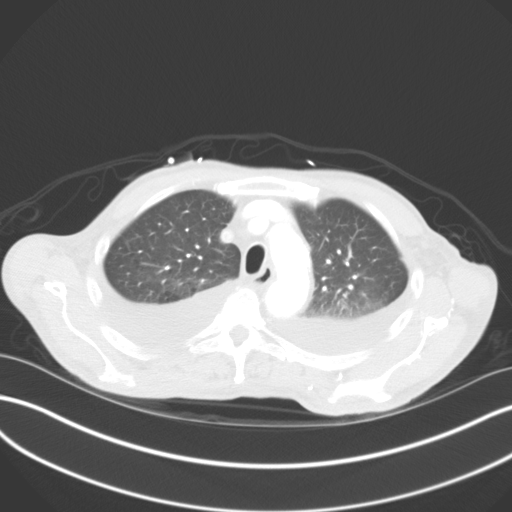
[im 64/74  lung]
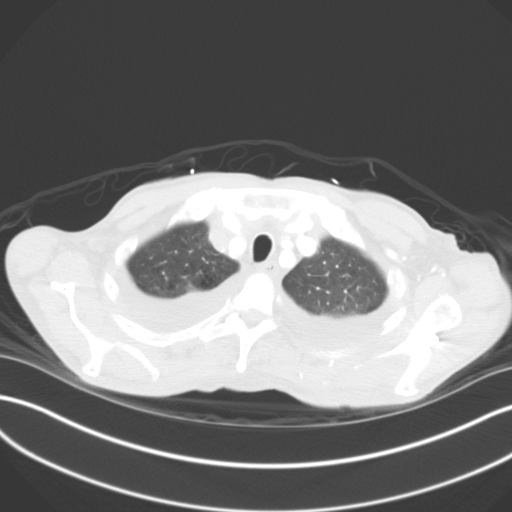
[im 69/74  lung]
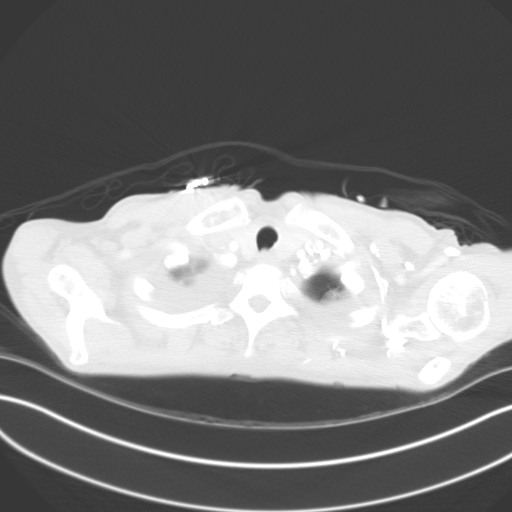

[Series 5: coronal · coronal · 0.71mm/px · 3 of 77 slices shown]
[im 16/77  lung]
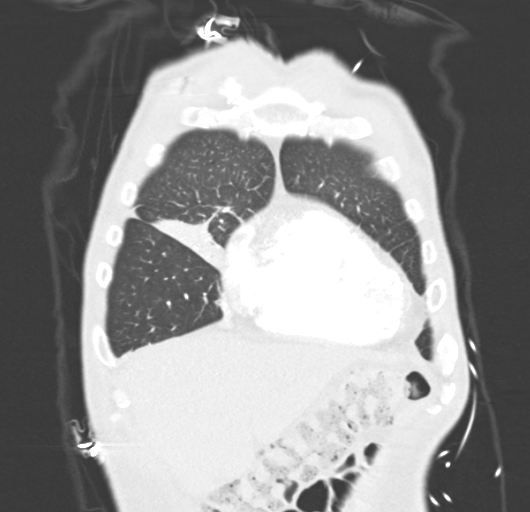
[im 31/77  lung]
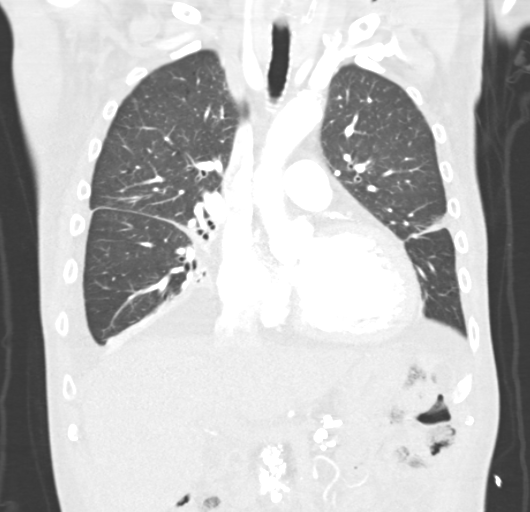
[im 46/77  lung]
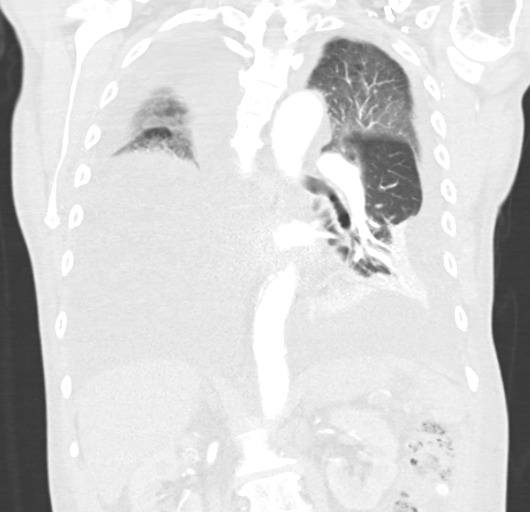

[15 of 36 positions shown; findings below may reference images not displayed]

FINDINGS: Moderate to large bilateral layering pleural effusions, the right is
larger. Simple fluid densitometry suggesting transudate fluid. No
pericardial effusion. Cardiomegaly. Widespread atherosclerotic
plaque in the aorta. Coronary artery involvement.

Compressive atelectasis in both lungs. There is also a 7 mm right
middle lobe lung nodule on series 3, image 38. Major airways are
patent.

No acute or suspicious osseous lesion. Mild chronic left lateral
tenth rib fracture. Mild scoliosis.

No axillary or mediastinal lymphadenopathy.

Visualized upper abdomen remarkable for confluent dystrophic
calcifications throughout the visible pancreas.
IMPRESSION: 1. Moderate to large right greater than left layering pleural
effusions with compressive atelectasis.
2. Seven mm right middle lobe lung nodule (series 3, image 38). If
the patient is at high risk for bronchogenic carcinoma, follow-up
chest CT at 3-6 months is recommended. If the patient is at low risk
for bronchogenic carcinoma, follow-up chest CT at 6-12 months is
recommended. This recommendation follows the consensus statement:
Guidelines for Management of Small Pulmonary Nodules Detected on CT
Scans: A Statement from the [HOSPITAL] as published in
3. Chronic calcific pancreatitis.

## 2016-01-23 IMAGING — DX DG PORTABLE PELVIS
1 series · 1 of 1 positions shown · non-contrast
Comparison: Left hip radiographs performed earlier today at [DATE]
a.m.

CLINICAL DATA: Postoperative radiographs for left-sided
hemiarthroplasty.

EXAM:
PORTABLE PELVIS 1-2 VIEWS

[ap]
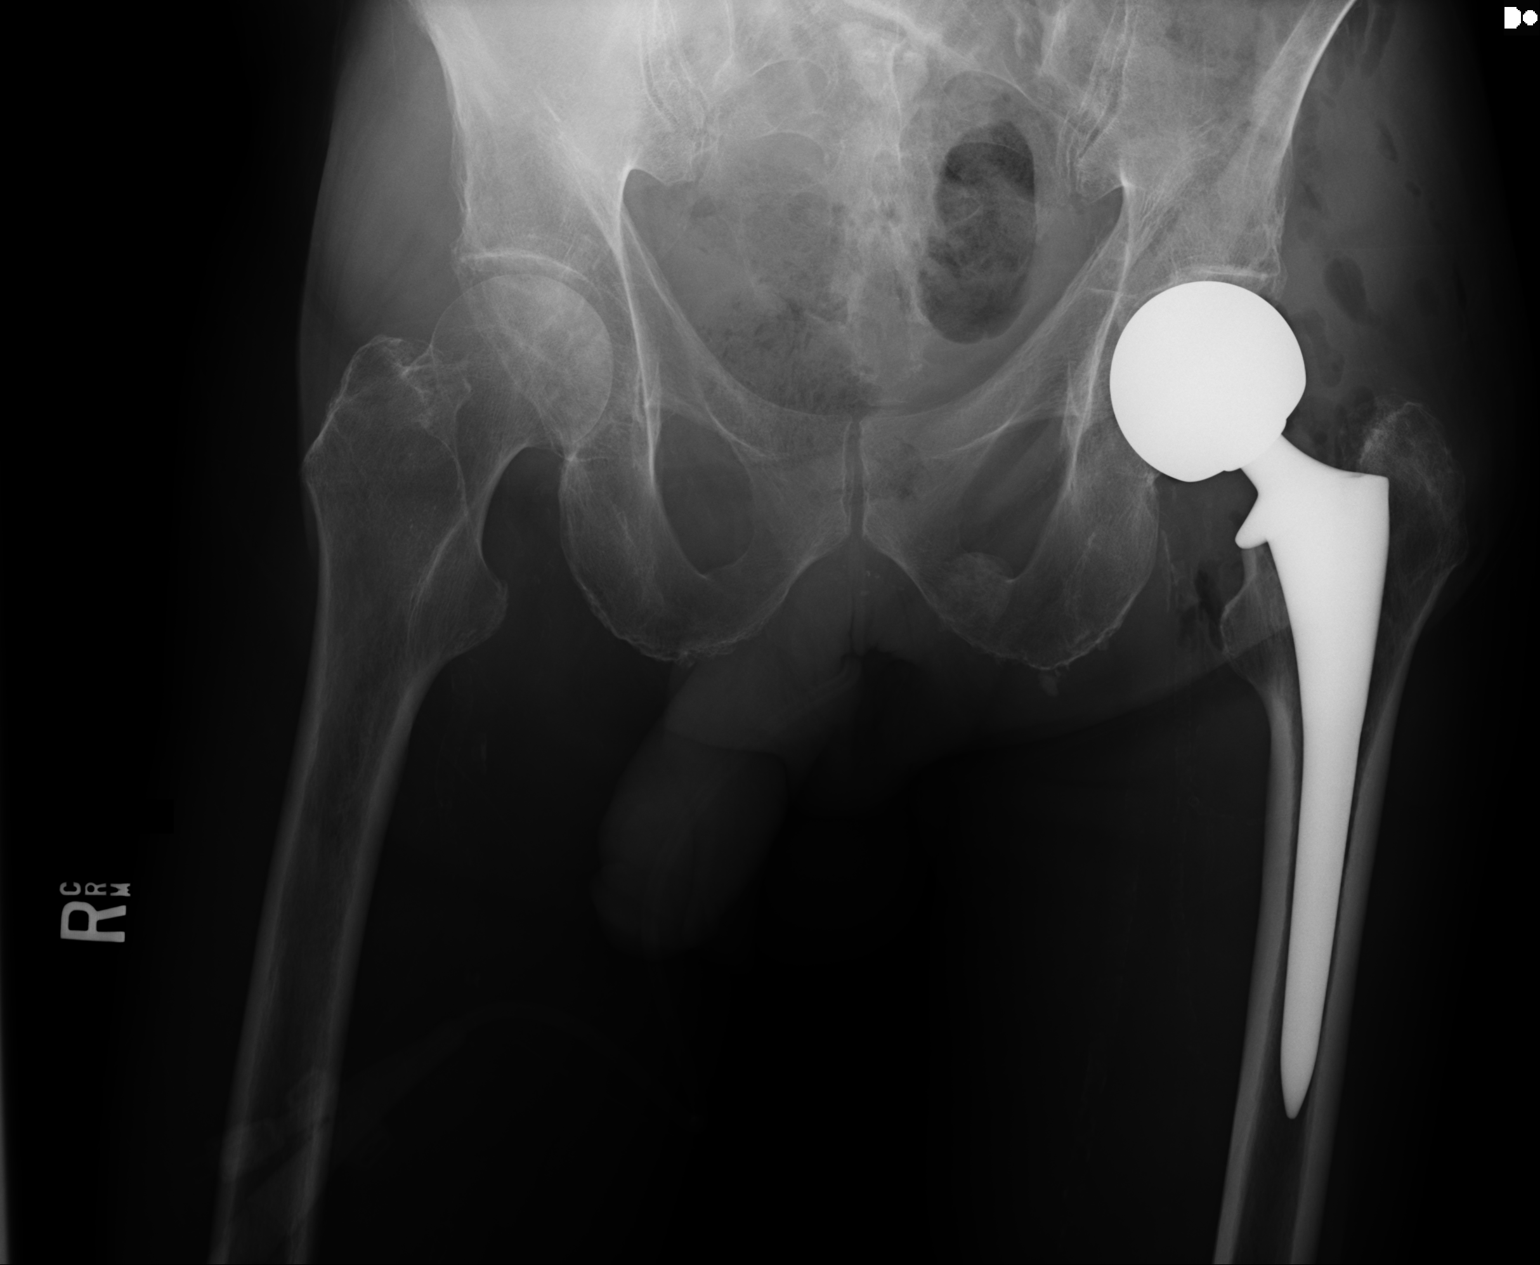

[1 of 1 positions shown; findings below may reference images not displayed]

FINDINGS: The patient is status post left hip hemiarthroplasty. The prosthesis
demonstrates normal alignment, without evidence of loosening. No new
fractures are seen. Overlying soft tissue air is noted. The right
hip is grossly unremarkable in appearance. The visualized portions
of the sacroiliac joints are grossly unremarkable. The visualized
bowel gas pattern is within normal limits.
IMPRESSION: Status post left hip hemiarthroplasty; the prosthesis demonstrates
normal alignment, without evidence of loosening. No new fracture
seen.

## 2016-01-23 IMAGING — CR DG HIP (WITH OR WITHOUT PELVIS) 2-3V*L*
3 series · 3 of 3 positions shown · non-contrast
Comparison: None.

CLINICAL DATA: Fall

EXAM:
LEFT HIP - COMPLETE 2+ VIEW

[t pelvis ap]
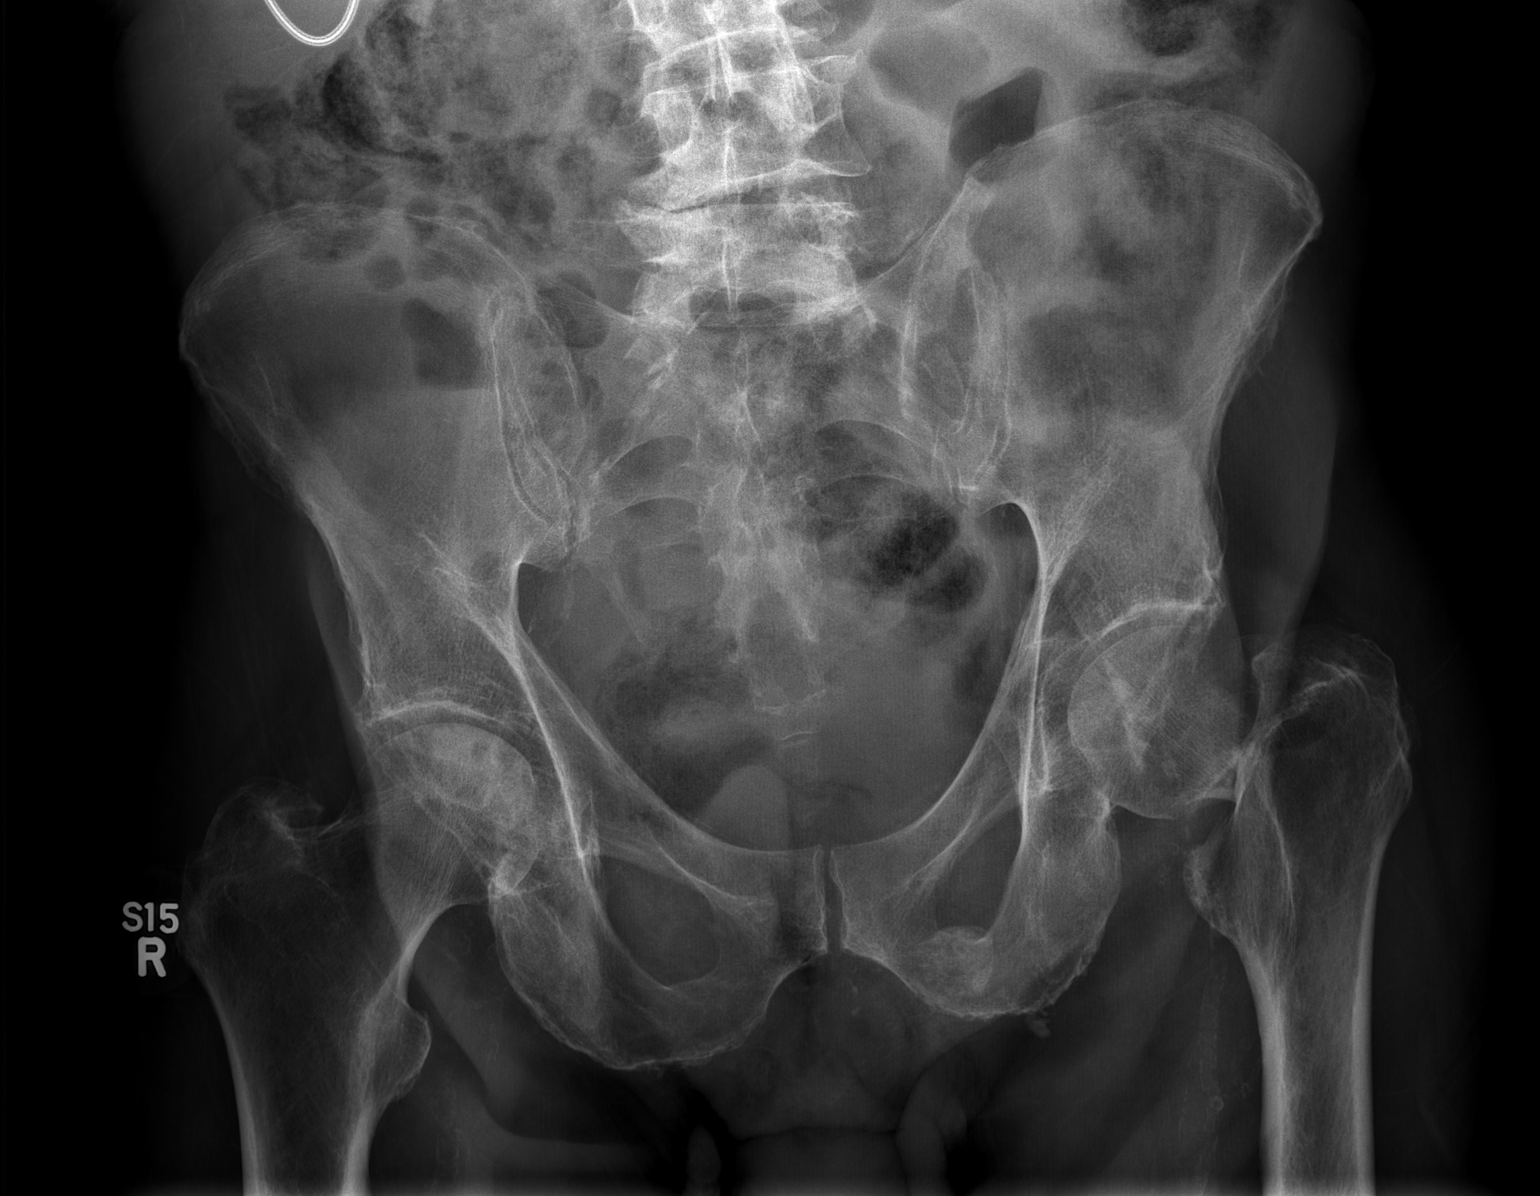

[t hip ap left]
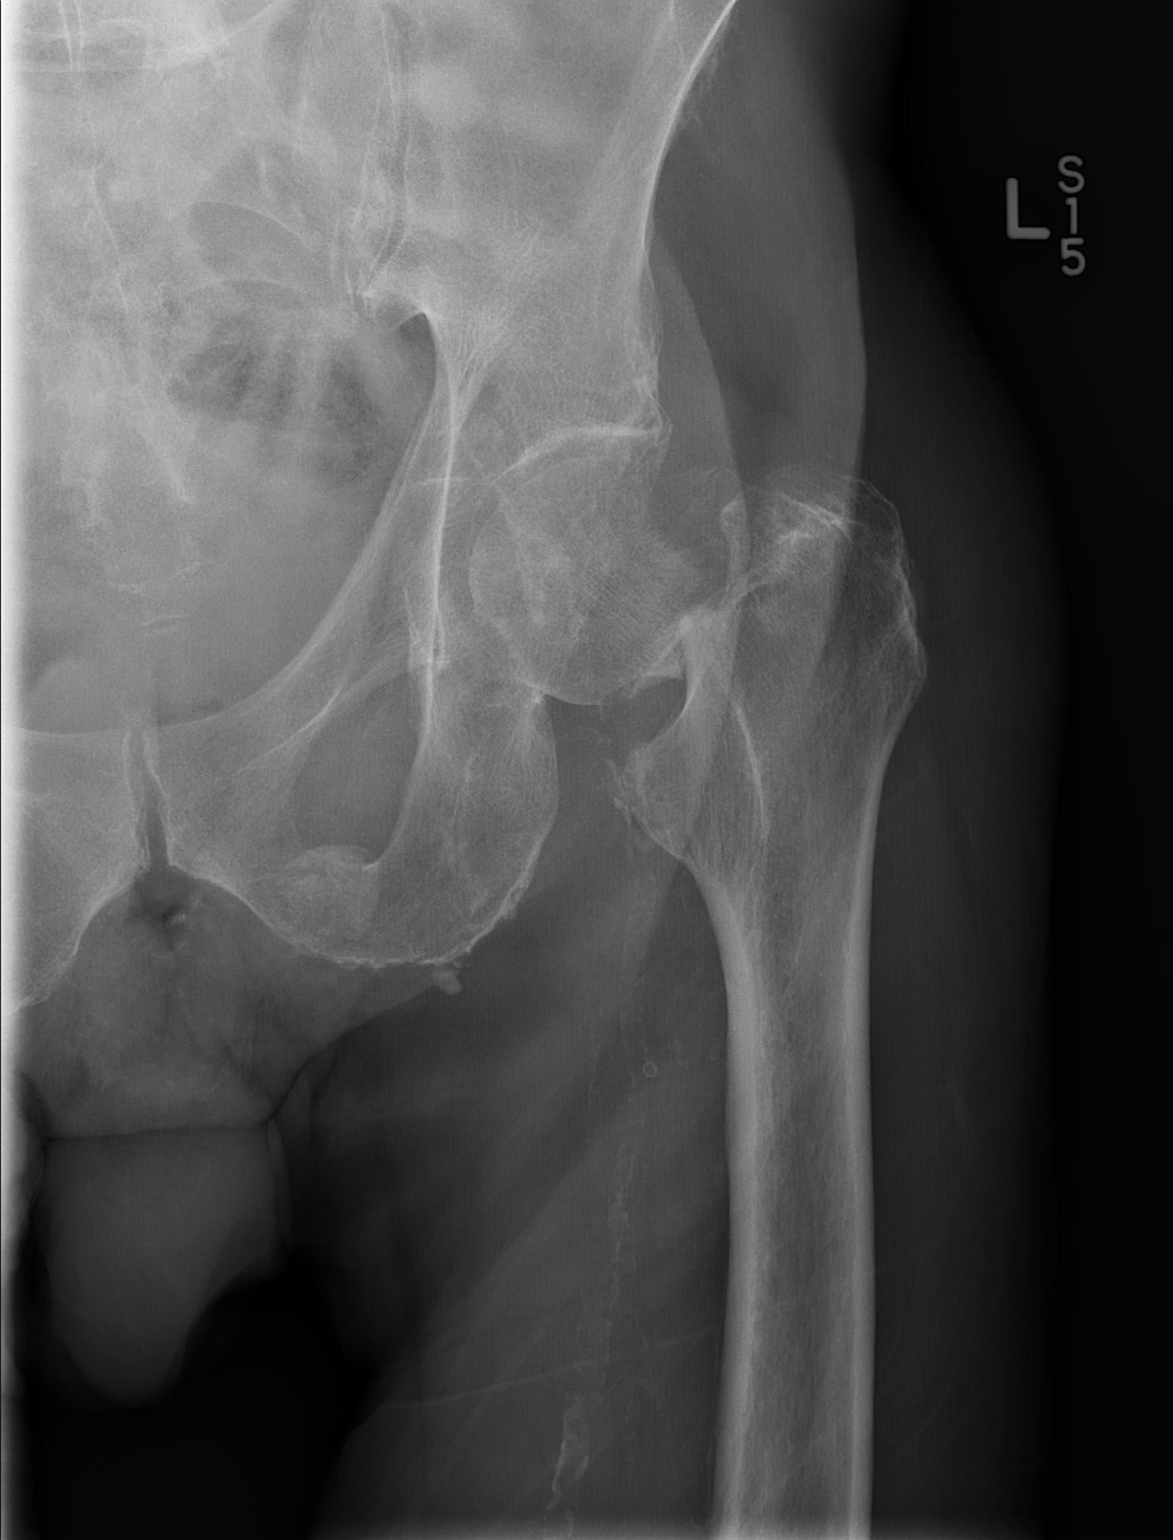

[x hip lat left]
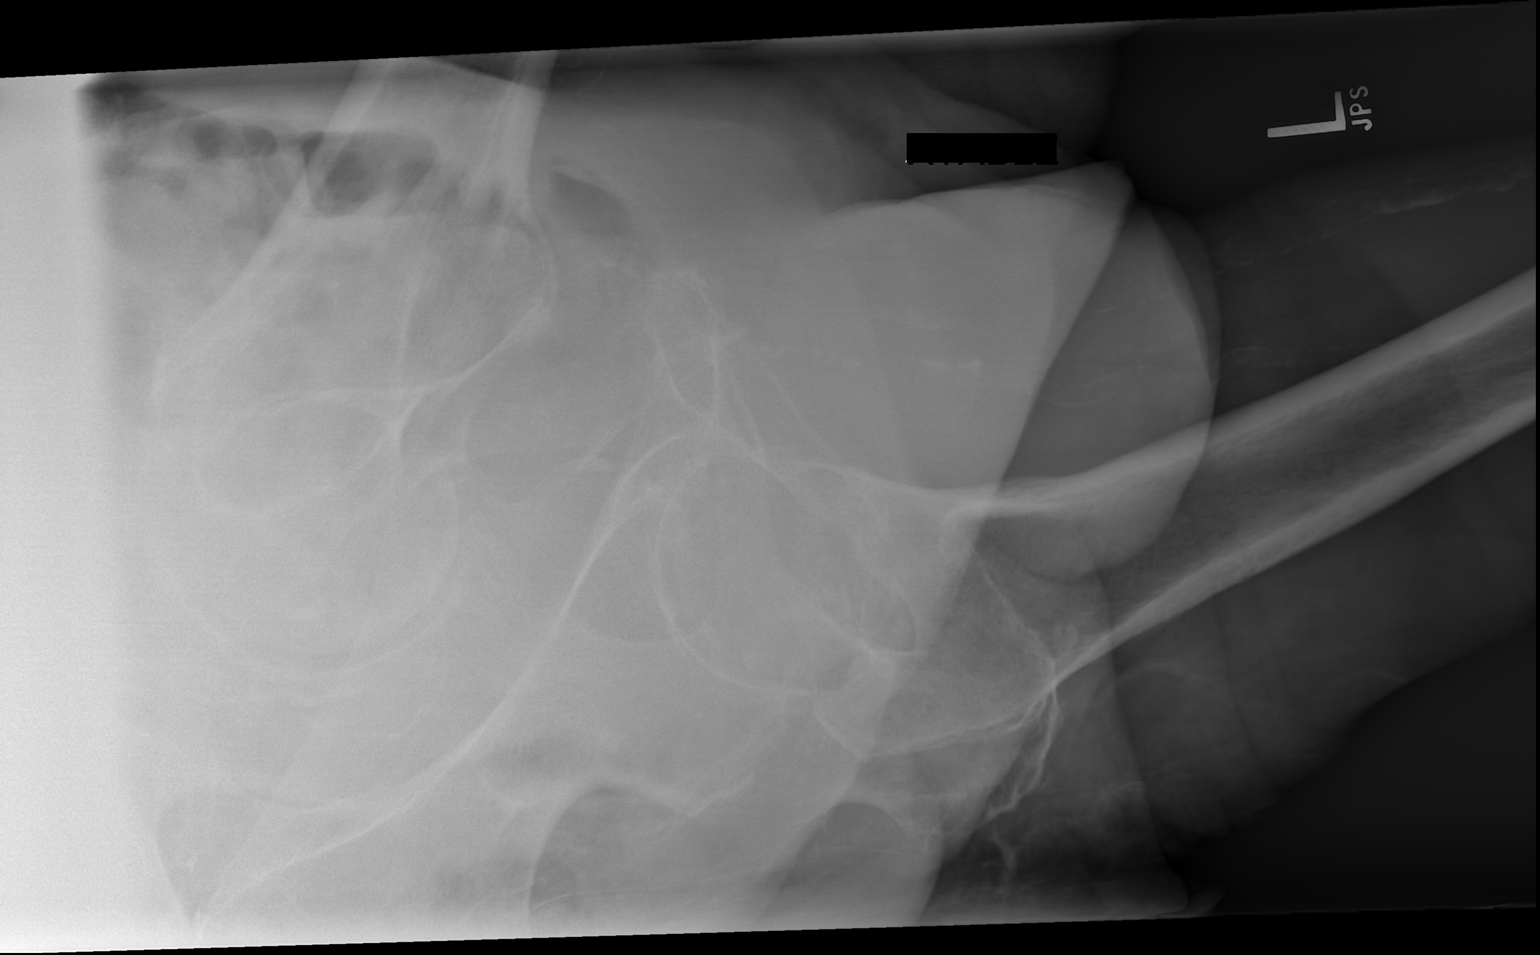

[3 of 3 positions shown; findings below may reference images not displayed]

FINDINGS: Fractures of the left femoral neck with some impaction and
angulation. Mild displacement. Hip joint appears normal on the left.
Chronic healed fracture left ischial healed tuberosity. .
IMPRESSION: Left femoral neck fracture.
